# Patient Record
Sex: Female | Born: 1975 | Race: White | Hispanic: No | Marital: Married | Smoking: Never smoker
Health system: Southern US, Community
[De-identification: ages and names within clinical notes are randomized; demographics above are authoritative.]

## PROBLEM LIST (undated history)

## (undated) DIAGNOSIS — Z9889 Other specified postprocedural states: Secondary | ICD-10-CM

## (undated) DIAGNOSIS — C801 Malignant (primary) neoplasm, unspecified: Secondary | ICD-10-CM

## (undated) DIAGNOSIS — K219 Gastro-esophageal reflux disease without esophagitis: Secondary | ICD-10-CM

## (undated) DIAGNOSIS — F32A Depression, unspecified: Secondary | ICD-10-CM

## (undated) DIAGNOSIS — E119 Type 2 diabetes mellitus without complications: Secondary | ICD-10-CM

## (undated) DIAGNOSIS — I82409 Acute embolism and thrombosis of unspecified deep veins of unspecified lower extremity: Secondary | ICD-10-CM

## (undated) DIAGNOSIS — F419 Anxiety disorder, unspecified: Secondary | ICD-10-CM

## (undated) DIAGNOSIS — O24419 Gestational diabetes mellitus in pregnancy, unspecified control: Secondary | ICD-10-CM

## (undated) DIAGNOSIS — M1990 Unspecified osteoarthritis, unspecified site: Secondary | ICD-10-CM

## (undated) DIAGNOSIS — I10 Essential (primary) hypertension: Secondary | ICD-10-CM

## (undated) DIAGNOSIS — R7303 Prediabetes: Secondary | ICD-10-CM

## (undated) HISTORY — DX: Gestational diabetes mellitus in pregnancy, unspecified control: O24.419

---

## 2016-09-09 MED FILL — QSYMIA 3.75 MG-23 MG CAP: 3.75-23 | 14 days supply | Qty: 14 | Fill #0

## 2016-10-07 DIAGNOSIS — D235 Other benign neoplasm of skin of trunk: Secondary | ICD-10-CM | POA: Diagnosis not present

## 2016-10-07 DIAGNOSIS — D485 Neoplasm of uncertain behavior of skin: Secondary | ICD-10-CM | POA: Diagnosis not present

## 2016-10-07 DIAGNOSIS — L814 Other melanin hyperpigmentation: Secondary | ICD-10-CM | POA: Diagnosis not present

## 2016-10-07 DIAGNOSIS — D1801 Hemangioma of skin and subcutaneous tissue: Secondary | ICD-10-CM | POA: Diagnosis not present

## 2016-10-14 DIAGNOSIS — Z6838 Body mass index (BMI) 38.0-38.9, adult: Secondary | ICD-10-CM | POA: Diagnosis not present

## 2016-10-14 DIAGNOSIS — R635 Abnormal weight gain: Secondary | ICD-10-CM | POA: Diagnosis not present

## 2016-10-14 DIAGNOSIS — E669 Obesity, unspecified: Secondary | ICD-10-CM | POA: Diagnosis not present

## 2016-10-14 MED FILL — UNIFINE PENTIPS 32GX5/32": 32G X 4 MM | 90 days supply | Qty: 100 | Fill #0

## 2016-10-14 MED FILL — UNIFINE PENTIPS 32GX5/32: 32G X 4 MM | 90 days supply | Qty: 100 | Fill #0

## 2016-10-14 MED FILL — SAXENDA 18 MG/3 ML PEN: 18 | 28 days supply | Qty: 15 | Fill #0

## 2016-11-09 MED FILL — SAXENDA 18 MG/3 ML PEN: 18 | 28 days supply | Qty: 15 | Fill #1

## 2016-12-27 DIAGNOSIS — K5903 Drug induced constipation: Secondary | ICD-10-CM | POA: Diagnosis not present

## 2016-12-27 DIAGNOSIS — R635 Abnormal weight gain: Secondary | ICD-10-CM | POA: Diagnosis not present

## 2016-12-27 DIAGNOSIS — Z713 Dietary counseling and surveillance: Secondary | ICD-10-CM | POA: Diagnosis not present

## 2016-12-27 DIAGNOSIS — F419 Anxiety disorder, unspecified: Secondary | ICD-10-CM | POA: Diagnosis not present

## 2016-12-27 MED FILL — BUPROPION HCL XL 150 MG TAB: 150 | 30 days supply | Qty: 30 | Fill #0

## 2017-01-19 DIAGNOSIS — Z713 Dietary counseling and surveillance: Secondary | ICD-10-CM | POA: Diagnosis not present

## 2017-01-19 DIAGNOSIS — Z6841 Body Mass Index (BMI) 40.0 and over, adult: Secondary | ICD-10-CM | POA: Diagnosis not present

## 2017-01-25 MED FILL — BUPROPION HCL XL 300 MG TAB: 300 | 30 days supply | Qty: 30 | Fill #0

## 2017-03-03 DIAGNOSIS — R7301 Impaired fasting glucose: Secondary | ICD-10-CM | POA: Diagnosis not present

## 2017-03-03 DIAGNOSIS — R635 Abnormal weight gain: Secondary | ICD-10-CM | POA: Diagnosis not present

## 2017-03-03 DIAGNOSIS — Z8632 Personal history of gestational diabetes: Secondary | ICD-10-CM | POA: Diagnosis not present

## 2017-03-03 MED FILL — BUPROPION HCL XL 300 MG TAB: 300 | 30 days supply | Qty: 30 | Fill #1

## 2017-03-03 MED FILL — metFORMIN HCL 500 MG TABS: 500 | 90 days supply | Qty: 90 | Fill #0

## 2017-03-24 DIAGNOSIS — R7301 Impaired fasting glucose: Secondary | ICD-10-CM | POA: Diagnosis not present

## 2017-03-24 DIAGNOSIS — E669 Obesity, unspecified: Secondary | ICD-10-CM | POA: Diagnosis not present

## 2017-03-24 DIAGNOSIS — R635 Abnormal weight gain: Secondary | ICD-10-CM | POA: Diagnosis not present

## 2017-03-24 MED FILL — NALTREXONE 50 MG TABLET: 50 | 60 days supply | Qty: 30 | Fill #0

## 2017-04-04 MED FILL — BUPROPION HCL XL 300 MG TAB: 300 | 30 days supply | Qty: 30 | Fill #2

## 2017-04-06 DIAGNOSIS — Z6841 Body Mass Index (BMI) 40.0 and over, adult: Secondary | ICD-10-CM | POA: Diagnosis not present

## 2017-04-06 DIAGNOSIS — Z713 Dietary counseling and surveillance: Secondary | ICD-10-CM | POA: Diagnosis not present

## 2017-05-02 MED FILL — BUPROPION HCL XL 300 MG TAB: 300 | 90 days supply | Qty: 90 | Fill #0

## 2017-05-04 DIAGNOSIS — Z6841 Body Mass Index (BMI) 40.0 and over, adult: Secondary | ICD-10-CM | POA: Diagnosis not present

## 2017-05-04 DIAGNOSIS — Z713 Dietary counseling and surveillance: Secondary | ICD-10-CM | POA: Diagnosis not present

## 2017-05-08 MED FILL — VYVANSE 30 MG CAPSULE: 30 | 30 days supply | Qty: 30 | Fill #0

## 2017-05-19 DIAGNOSIS — Z7984 Long term (current) use of oral hypoglycemic drugs: Secondary | ICD-10-CM | POA: Diagnosis not present

## 2017-05-19 DIAGNOSIS — Z6841 Body Mass Index (BMI) 40.0 and over, adult: Secondary | ICD-10-CM | POA: Diagnosis not present

## 2017-05-19 DIAGNOSIS — R7301 Impaired fasting glucose: Secondary | ICD-10-CM | POA: Diagnosis not present

## 2017-06-12 MED FILL — metFORMIN HCL 500 MG TABS: 500 | 90 days supply | Qty: 90 | Fill #0

## 2017-06-12 MED FILL — VYVANSE 30 MG CAPSULE: 30 | 30 days supply | Qty: 30 | Fill #0

## 2017-07-24 MED FILL — SERTRALINE HCL 25 MG TABLET: 25 | 30 days supply | Qty: 30 | Fill #0

## 2017-07-24 MED FILL — VYVANSE 30 MG CAPSULE: 30 | 30 days supply | Qty: 30 | Fill #0

## 2017-08-03 DIAGNOSIS — Z6841 Body Mass Index (BMI) 40.0 and over, adult: Secondary | ICD-10-CM | POA: Diagnosis not present

## 2017-08-03 DIAGNOSIS — Z713 Dietary counseling and surveillance: Secondary | ICD-10-CM | POA: Diagnosis not present

## 2017-08-16 MED FILL — BUPROPION HCL XL 300 MG TAB: 300 | 90 days supply | Qty: 90 | Fill #0

## 2017-08-29 MED FILL — VYVANSE 30 MG CAPSULE: 30 | 30 days supply | Qty: 30 | Fill #0

## 2017-10-02 MED FILL — metFORMIN HCL 500 MG TABS: 500 | 90 days supply | Qty: 90 | Fill #0

## 2017-10-02 MED FILL — VYVANSE 30 MG CAPSULE: 30 | 30 days supply | Qty: 30 | Fill #0

## 2017-10-06 DIAGNOSIS — L821 Other seborrheic keratosis: Secondary | ICD-10-CM | POA: Diagnosis not present

## 2017-10-06 DIAGNOSIS — D225 Melanocytic nevi of trunk: Secondary | ICD-10-CM | POA: Diagnosis not present

## 2017-10-06 DIAGNOSIS — D1801 Hemangioma of skin and subcutaneous tissue: Secondary | ICD-10-CM | POA: Diagnosis not present

## 2017-10-13 DIAGNOSIS — Z6841 Body Mass Index (BMI) 40.0 and over, adult: Secondary | ICD-10-CM | POA: Diagnosis not present

## 2017-10-13 DIAGNOSIS — R7301 Impaired fasting glucose: Secondary | ICD-10-CM | POA: Diagnosis not present

## 2017-10-13 DIAGNOSIS — Z7984 Long term (current) use of oral hypoglycemic drugs: Secondary | ICD-10-CM | POA: Diagnosis not present

## 2017-11-03 MED FILL — VYVANSE 40 MG CAPSULE: 40 | 30 days supply | Qty: 30 | Fill #0

## 2017-11-29 MED FILL — BUPROPION HCL XL 300 MG TAB: 300 | 90 days supply | Qty: 90 | Fill #0

## 2017-12-01 MED FILL — VYVANSE 40 MG CAPSULE: 40 | 30 days supply | Qty: 30 | Fill #0

## 2017-12-15 DIAGNOSIS — R7301 Impaired fasting glucose: Secondary | ICD-10-CM | POA: Diagnosis not present

## 2017-12-15 DIAGNOSIS — Z6841 Body Mass Index (BMI) 40.0 and over, adult: Secondary | ICD-10-CM | POA: Diagnosis not present

## 2018-01-04 MED FILL — VYVANSE 40 MG CAPSULE: 40 | 30 days supply | Qty: 30 | Fill #0

## 2018-02-02 DIAGNOSIS — R41840 Attention and concentration deficit: Secondary | ICD-10-CM | POA: Diagnosis not present

## 2018-02-02 DIAGNOSIS — R7301 Impaired fasting glucose: Secondary | ICD-10-CM | POA: Diagnosis not present

## 2018-02-02 DIAGNOSIS — G4710 Hypersomnia, unspecified: Secondary | ICD-10-CM | POA: Diagnosis not present

## 2018-02-02 DIAGNOSIS — Z6841 Body Mass Index (BMI) 40.0 and over, adult: Secondary | ICD-10-CM | POA: Diagnosis not present

## 2018-02-02 MED FILL — VYVANSE 50 MG CAPSULE: 50 | 30 days supply | Qty: 30 | Fill #0

## 2018-03-06 MED FILL — BUPROPION HCL XL 300 MG TAB: 300 | 90 days supply | Qty: 90 | Fill #0

## 2018-03-06 MED FILL — metFORMIN HCL 500 MG TABS: 500 | 90 days supply | Qty: 90 | Fill #0

## 2018-03-06 MED FILL — VYVANSE 50 MG CAPSULE: 50 | 30 days supply | Qty: 30 | Fill #0

## 2018-04-06 DIAGNOSIS — Z7984 Long term (current) use of oral hypoglycemic drugs: Secondary | ICD-10-CM | POA: Diagnosis not present

## 2018-04-06 DIAGNOSIS — Z6841 Body Mass Index (BMI) 40.0 and over, adult: Secondary | ICD-10-CM | POA: Diagnosis not present

## 2018-04-06 DIAGNOSIS — R7301 Impaired fasting glucose: Secondary | ICD-10-CM | POA: Diagnosis not present

## 2018-04-06 MED FILL — VYVANSE 50 MG CAPSULE: 50 | 30 days supply | Qty: 30 | Fill #0

## 2018-04-13 DIAGNOSIS — F4322 Adjustment disorder with anxiety: Secondary | ICD-10-CM | POA: Diagnosis not present

## 2018-05-04 DIAGNOSIS — F4322 Adjustment disorder with anxiety: Secondary | ICD-10-CM | POA: Diagnosis not present

## 2018-05-08 MED FILL — VYVANSE 60 MG CAPSULE: 60 | 30 days supply | Qty: 30 | Fill #0

## 2018-05-10 ENCOUNTER — Ambulatory Visit (INDEPENDENT_AMBULATORY_CARE_PROVIDER_SITE_OTHER): Payer: Self-pay | Admitting: Family Medicine

## 2018-05-10 VITALS — BP 110/80 | HR 88 | Temp 98.3°F | Resp 16 | Wt 257.2 lb

## 2018-05-10 DIAGNOSIS — Z0000 Encounter for general adult medical examination without abnormal findings: Secondary | ICD-10-CM

## 2018-05-10 NOTE — Patient Instructions (Signed)

## 2018-05-10 NOTE — Progress Notes (Signed)
Audrey Rice is a 42 y.o. female who presents today with need for annual physical. She denies any chronic health conditions and is followed by provider for weight management. She denies any acute health concerns today.  Review of Systems  Constitutional: Negative for chills, fever and malaise/fatigue.  HENT: Negative for congestion, ear discharge, ear pain, sinus pain and sore throat.   Eyes: Negative.   Respiratory: Negative for cough, sputum production and shortness of breath.   Cardiovascular: Negative.  Negative for chest pain.  Gastrointestinal: Negative for abdominal pain, diarrhea, nausea and vomiting.  Genitourinary: Negative for dysuria, frequency, hematuria and urgency.  Musculoskeletal: Negative for myalgias.  Skin: Negative.   Neurological: Negative for headaches.  Endo/Heme/Allergies: Negative.   Psychiatric/Behavioral: Negative.     O: Vitals:   05/10/18 1012  BP: 110/80  Pulse: 88  Resp: 16  Temp: 98.3 F (36.8 C)  SpO2: 99%     Physical Exam  Constitutional: She is oriented to person, place, and time. Vital signs are normal. She appears well-developed and well-nourished. She is active.  Non-toxic appearance. She does not have a sickly appearance.  HENT:  Head: Normocephalic.  Right Ear: Hearing, tympanic membrane, external ear and ear canal normal.  Left Ear: Hearing, tympanic membrane, external ear and ear canal normal.  Nose: Nose normal.  Mouth/Throat: Uvula is midline and oropharynx is clear and moist.  Neck: Normal range of motion. Neck supple.  Cardiovascular: Normal rate, regular rhythm, normal heart sounds and normal pulses.  Pulmonary/Chest: Effort normal and breath sounds normal.  Abdominal: Soft. Bowel sounds are normal.  Musculoskeletal: Normal range of motion.  Lymphadenopathy:       Head (right side): No submental and no submandibular adenopathy present.       Head (left side): No submental and no submandibular adenopathy present.    She  has no cervical adenopathy.  Neurological: She is alert and oriented to person, place, and time. She has normal strength. No cranial nerve deficit or sensory deficit. She displays a negative Romberg sign. GCS eye subscore is 4. GCS verbal subscore is 5. GCS motor subscore is 6.  Cognitive recall, balance and strength intact  Psychiatric: She has a normal mood and affect.  PHQ-9- negative  Vitals reviewed.    A: 1. Physical exam      P: Exam findings, diagnosis etiology and medication use and indications reviewed with patient. Follow- Up and discharge instructions provided. No emergent/urgent issues found on exam.  Patient verbalized understanding of information provided and agrees with plan of care (POC), all questions answered.  1. Physical exam No acute findings exam WNL

## 2018-06-15 DIAGNOSIS — F4322 Adjustment disorder with anxiety: Secondary | ICD-10-CM | POA: Diagnosis not present

## 2018-06-18 MED FILL — VYVANSE 60 MG CAPSULE: 60 | 30 days supply | Qty: 30 | Fill #0

## 2018-07-04 MED FILL — BUPROPION HCL XL 300 MG TAB: 300 | 90 days supply | Qty: 90 | Fill #0

## 2018-07-04 MED FILL — metFORMIN HCL 500 MG TABS: 500 | 90 days supply | Qty: 90 | Fill #0

## 2018-07-06 DIAGNOSIS — F4322 Adjustment disorder with anxiety: Secondary | ICD-10-CM | POA: Diagnosis not present

## 2018-07-20 MED FILL — VYVANSE 60 MG CAPSULE: 60 | 30 days supply | Qty: 30 | Fill #0

## 2018-07-27 DIAGNOSIS — F4322 Adjustment disorder with anxiety: Secondary | ICD-10-CM | POA: Diagnosis not present

## 2018-08-24 MED FILL — VYVANSE 60 MG CAPSULE: 60 | 30 days supply | Qty: 30 | Fill #0

## 2018-08-27 DIAGNOSIS — F4322 Adjustment disorder with anxiety: Secondary | ICD-10-CM | POA: Diagnosis not present

## 2018-09-21 MED FILL — VYVANSE 60 MG CAPSULE: 60 | 30 days supply | Qty: 30 | Fill #0

## 2018-09-24 DIAGNOSIS — F4322 Adjustment disorder with anxiety: Secondary | ICD-10-CM | POA: Diagnosis not present

## 2018-09-24 MED FILL — BuPROPion HCL ER (XL) 300 M: 300 | 90 days supply | Qty: 90 | Fill #0

## 2018-09-24 MED FILL — metFORMIN HCL 500 MG TABS: 500 | 90 days supply | Qty: 90 | Fill #0

## 2018-10-08 DIAGNOSIS — D225 Melanocytic nevi of trunk: Secondary | ICD-10-CM | POA: Diagnosis not present

## 2018-10-08 DIAGNOSIS — D1801 Hemangioma of skin and subcutaneous tissue: Secondary | ICD-10-CM | POA: Diagnosis not present

## 2018-10-08 DIAGNOSIS — L738 Other specified follicular disorders: Secondary | ICD-10-CM | POA: Diagnosis not present

## 2018-10-08 DIAGNOSIS — L821 Other seborrheic keratosis: Secondary | ICD-10-CM | POA: Diagnosis not present

## 2018-10-19 MED FILL — CLINDAMYCIN PH 1% SOLUTION: 1 | 30 days supply | Qty: 60 | Fill #0

## 2018-10-30 MED FILL — VYVANSE 60 MG CAPSULE: 60 | 30 days supply | Qty: 30 | Fill #0

## 2018-11-06 DIAGNOSIS — F4322 Adjustment disorder with anxiety: Secondary | ICD-10-CM | POA: Diagnosis not present

## 2018-12-06 MED FILL — CLINDAMYCIN PH 1% SOLUTION: 1 | 30 days supply | Qty: 60 | Fill #1

## 2018-12-14 DIAGNOSIS — F4322 Adjustment disorder with anxiety: Secondary | ICD-10-CM | POA: Diagnosis not present

## 2019-01-01 DIAGNOSIS — Z6841 Body Mass Index (BMI) 40.0 and over, adult: Secondary | ICD-10-CM | POA: Diagnosis not present

## 2019-01-01 DIAGNOSIS — R5383 Other fatigue: Secondary | ICD-10-CM | POA: Diagnosis not present

## 2019-01-01 DIAGNOSIS — E559 Vitamin D deficiency, unspecified: Secondary | ICD-10-CM | POA: Diagnosis not present

## 2019-01-01 DIAGNOSIS — R7301 Impaired fasting glucose: Secondary | ICD-10-CM | POA: Diagnosis not present

## 2019-01-01 DIAGNOSIS — F329 Major depressive disorder, single episode, unspecified: Secondary | ICD-10-CM | POA: Diagnosis not present

## 2019-01-03 MED FILL — VYVANSE 30 MG CAPSULE: 30 | 21 days supply | Qty: 15 | Fill #0

## 2019-02-04 DIAGNOSIS — Z6841 Body Mass Index (BMI) 40.0 and over, adult: Secondary | ICD-10-CM | POA: Diagnosis not present

## 2019-02-04 DIAGNOSIS — Z713 Dietary counseling and surveillance: Secondary | ICD-10-CM | POA: Diagnosis not present

## 2019-02-04 DIAGNOSIS — R948 Abnormal results of function studies of other organs and systems: Secondary | ICD-10-CM | POA: Diagnosis not present

## 2019-02-08 MED FILL — metFORMIN HCL 500 MG TABS: 500 | 90 days supply | Qty: 90 | Fill #0

## 2019-02-08 MED FILL — buPROPion HCL ER (XL) 300 M: 300 | 90 days supply | Qty: 90 | Fill #0

## 2019-02-12 DIAGNOSIS — Z6841 Body Mass Index (BMI) 40.0 and over, adult: Secondary | ICD-10-CM | POA: Diagnosis not present

## 2019-02-12 DIAGNOSIS — E669 Obesity, unspecified: Secondary | ICD-10-CM | POA: Diagnosis not present

## 2019-02-12 MED FILL — LOMAIRA 8 MG TABLET: 8 | 30 days supply | Qty: 30 | Fill #0

## 2019-04-01 MED FILL — LOMAIRA 8 MG TABLET: 8 | 30 days supply | Qty: 30 | Fill #0

## 2019-05-15 MED FILL — metFORMIN HCL 500 MG TABS: 500 | 90 days supply | Qty: 90 | Fill #0

## 2019-05-15 MED FILL — LOMAIRA 8 MG TABLET: 8 | 30 days supply | Qty: 30 | Fill #0

## 2019-05-15 MED FILL — buPROPion HCL ER (XL) 300 M: 300 | 90 days supply | Qty: 90 | Fill #0

## 2019-06-24 MED FILL — LOMAIRA 8 MG TABLET: 8 | 30 days supply | Qty: 30 | Fill #0

## 2019-08-01 MED FILL — LOMAIRA 8 MG TABLET: 8 | 30 days supply | Qty: 30 | Fill #0

## 2019-08-27 DIAGNOSIS — E8881 Metabolic syndrome: Secondary | ICD-10-CM | POA: Diagnosis not present

## 2019-08-27 DIAGNOSIS — Z6841 Body Mass Index (BMI) 40.0 and over, adult: Secondary | ICD-10-CM | POA: Diagnosis not present

## 2019-08-27 DIAGNOSIS — R635 Abnormal weight gain: Secondary | ICD-10-CM | POA: Diagnosis not present

## 2019-08-27 MED FILL — buPROPion HCL ER (XL) 300 M: 300 | 90 days supply | Qty: 90 | Fill #0

## 2019-08-27 MED FILL — buPROPion HCL ER (XL) 150 M: 150 | 90 days supply | Qty: 90 | Fill #0

## 2019-08-27 MED FILL — metFORMIN HCL 500 MG TABS: 500 | 90 days supply | Qty: 180 | Fill #0

## 2019-08-30 MED FILL — LOMAIRA 8 MG TABLET: 8 | 30 days supply | Qty: 60 | Fill #0

## 2019-09-24 DIAGNOSIS — Z6841 Body Mass Index (BMI) 40.0 and over, adult: Secondary | ICD-10-CM | POA: Diagnosis not present

## 2019-09-24 DIAGNOSIS — R7301 Impaired fasting glucose: Secondary | ICD-10-CM | POA: Diagnosis not present

## 2019-09-24 DIAGNOSIS — R635 Abnormal weight gain: Secondary | ICD-10-CM | POA: Diagnosis not present

## 2019-10-02 DIAGNOSIS — H52213 Irregular astigmatism, bilateral: Secondary | ICD-10-CM | POA: Diagnosis not present

## 2019-10-02 DIAGNOSIS — H524 Presbyopia: Secondary | ICD-10-CM | POA: Diagnosis not present

## 2019-10-02 DIAGNOSIS — H5213 Myopia, bilateral: Secondary | ICD-10-CM | POA: Diagnosis not present

## 2019-10-16 ENCOUNTER — Encounter: Payer: Self-pay | Admitting: Medical

## 2019-10-16 MED FILL — LOMAIRA 8 MG TABLET: 8 | 30 days supply | Qty: 60 | Fill #0

## 2019-11-04 ENCOUNTER — Encounter: Payer: Self-pay | Admitting: Advanced Practice Midwife

## 2019-11-04 ENCOUNTER — Ambulatory Visit (INDEPENDENT_AMBULATORY_CARE_PROVIDER_SITE_OTHER): Payer: 59 | Admitting: Advanced Practice Midwife

## 2019-11-04 ENCOUNTER — Other Ambulatory Visit: Payer: Self-pay

## 2019-11-04 VITALS — BP 126/91 | HR 83 | Wt 277.1 lb

## 2019-11-04 DIAGNOSIS — Z1151 Encounter for screening for human papillomavirus (HPV): Secondary | ICD-10-CM

## 2019-11-04 DIAGNOSIS — Z1231 Encounter for screening mammogram for malignant neoplasm of breast: Secondary | ICD-10-CM

## 2019-11-04 DIAGNOSIS — Z124 Encounter for screening for malignant neoplasm of cervix: Secondary | ICD-10-CM | POA: Diagnosis not present

## 2019-11-04 DIAGNOSIS — Z01419 Encounter for gynecological examination (general) (routine) without abnormal findings: Secondary | ICD-10-CM | POA: Diagnosis not present

## 2019-11-04 DIAGNOSIS — Z9189 Other specified personal risk factors, not elsewhere classified: Secondary | ICD-10-CM | POA: Insufficient documentation

## 2019-11-04 NOTE — Patient Instructions (Signed)

## 2019-11-04 NOTE — Progress Notes (Signed)
GYNECOLOGY ANNUAL PREVENTATIVE CARE ENCOUNTER NOTE  History:     Audrey Rice is a 43 y.o. [id number] female here for a routine annual gynecologic exam.  Current complaints: none.   Denies abnormal vaginal bleeding, discharge, pelvic pain, problems with intercourse or other gynecologic concerns.    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 Denies SI, HI, IPV. Difficulty finding time for exercise and self-care amidst COVID precautions but overall happy with her work/life balance.   Gynecologic History Patient's last menstrual period was 10/17/2019 (approximate). Contraception: vasectomy Last Pap: 7 years ago. Results were: normal with negative HPV  Obstetric History OB History  Gravida Para Term Preterm AB Living  3 3 3     3   SAB TAB Ectopic Multiple Live Births          3    # Outcome Date GA Lbr Len/2nd Weight Sex Delivery Anes PTL Lv  3 Term 2012     Vag-Spont     2 Term 2009     Vag-Spont     1 Term 2006     Vag-Spont       No past medical history on file.   Current Outpatient Medications on File Prior to Visit  Medication Sig Dispense Refill  . buPROPion (WELLBUTRIN XL) 300 MG 24 hr tablet Take 450 mg by mouth daily.     Marland Kitchen MAGNESIUM PO Take by mouth.    . Multiple Vitamin (MULTIVITAMIN) capsule Take by mouth.    . Phentermine HCl 8 MG TABS 8 mg 2 (two) times daily as needed.    . vitamin B-12 (CYANOCOBALAMIN) 100 MCG tablet Take by mouth.     No current facility-administered medications on file prior to visit.     Not on File  Social History:  reports that she has never smoked. She has never used smokeless tobacco.  No family history on file.  The following portions of the patient's history were reviewed and updated as appropriate: allergies, current medications, past family history, past medical history, past social history, past surgical history and problem list.  Review of Systems Pertinent items noted in HPI and remainder of  comprehensive ROS otherwise negative.  Physical Exam:  BP (!) 126/91   Pulse 83   Wt 277 lb 1.6 oz (125.7 kg)   LMP 10/17/2019 (Approximate)  CONSTITUTIONAL: Well-developed, well-nourished female in no acute distress.  HENT:  Normocephalic, atraumatic, External right and left ear normal. Oropharynx is clear and moist EYES: Conjunctivae and EOM are normal. Pupils are equal, round, and reactive to light. No scleral icterus.  NECK: Normal range of motion, supple, no masses.  Normal thyroid.  SKIN: Skin is warm and dry. No rash noted. Not diaphoretic. No erythema. No pallor. MUSCULOSKELETAL: Normal range of motion. No tenderness.  No cyanosis, clubbing, or edema.  2+ distal pulses. NEUROLOGIC: Alert and oriented to person, place, and time. Normal reflexes, muscle tone coordination.  PSYCHIATRIC: Normal mood and affect. Normal behavior. Normal judgment and thought content. CARDIOVASCULAR: Normal heart rate noted, regular rhythm RESPIRATORY: Clear to auscultation bilaterally. Effort and breath sounds normal, no problems with respiration noted. BREASTS: Symmetric in size. No masses, tenderness, skin changes, nipple drainage, or lymphadenopathy bilaterally. ABDOMEN: Soft, no distention noted.  No tenderness, rebound or guarding.  PELVIC: Normal appearing external genitalia and urethral meatus; normal appearing vaginal mucosa and cervix.  No abnormal discharge noted.  Pap smear obtained.  Normal uterine size, no other palpable masses, no uterine or  adnexal tenderness.   Assessment and Plan:    1. Well woman exam - No concerning findings on physical exam - Cytology - PAP( [place]) - Vitamin D (25 hydroxy) - CBC - Hemoglobin [id number] - Basic Metabolic Panel (BMET)  2. Encounter for screening mammogram for breast cancer  - MM 3D SCREEN BREAST BILATERAL; Future  Will follow up results of pap smear and manage accordingly. Mammogram scheduled Routine preventative health maintenance measures  emphasized. Please refer to After Visit Summary for other counseling recommendations.      Mallie Snooks, MSN, CNM Certified Nurse Midwife, [facility] for [employer], [place] 11/04/19 8:04 PM

## 2019-11-05 LAB — BASIC METABOLIC PANEL
BUN/Creatinine Ratio: 18 (ref 9–23)
BUN: 17 mg/dL (ref 6–24)
CO2: 22 mmol/L (ref 20–29)
Calcium: 9.6 mg/dL (ref 8.7–10.2)
Chloride: 105 mmol/L (ref 96–106)
Creatinine, Ser: 0.97 mg/dL (ref 0.57–1.00)
GFR calc Af Amer: 84 mL/min/{1.73_m2} (ref >59)
GFR calc non Af Amer: 73 mL/min/{1.73_m2} (ref >59)
Glucose: 92 mg/dL (ref 65–99)
Potassium: 5 mmol/L (ref 3.5–5.2)
Sodium: 140 mmol/L (ref 134–144)

## 2019-11-05 LAB — CBC
Hematocrit: 40.2 % (ref 34.0–46.6)
Hemoglobin: 13 g/dL (ref 11.1–15.9)
MCH: 28.4 pg (ref 26.6–33.0)
MCHC: 32.3 g/dL (ref 31.5–35.7)
MCV: 88 fL (ref 79–97)
Platelets: 318 10*3/uL (ref 150–450)
RBC: 4.57 x10E6/uL (ref 3.77–5.28)
RDW: 13.8 % (ref 11.7–15.4)
WBC: 9.6 10*3/uL (ref 3.4–10.8)

## 2019-11-05 LAB — HEMOGLOBIN A1C
Est. average glucose Bld gHb Est-mCnc: 114 mg/dL
Hgb A1c MFr Bld: 5.6 % (ref 4.8–5.6)

## 2019-11-05 LAB — VITAMIN D 25 HYDROXY (VIT D DEFICIENCY, FRACTURES): Vit D, 25-Hydroxy: 38.2 ng/mL (ref 30.0–100.0)

## 2019-11-08 LAB — CYTOLOGY - PAP
Comment: NEGATIVE
Diagnosis: NEGATIVE
High risk HPV: NEGATIVE

## 2019-12-27 ENCOUNTER — Ambulatory Visit: Payer: 59

## 2020-01-03 MED FILL — LOMAIRA 8 MG TABLET: 8 | 30 days supply | Qty: 60 | Fill #0

## 2020-01-03 MED FILL — BUPROPION HCL ER (XL) 300 M: 300 | 90 days supply | Qty: 90 | Fill #0

## 2020-01-03 MED FILL — buPROPion HCL ER (XL) 150 M: 150 | 90 days supply | Qty: 90 | Fill #0

## 2020-01-28 ENCOUNTER — Ambulatory Visit
Admission: RE | Admit: 2020-01-28 | Discharge: 2020-01-28 | Disposition: A | Discharge status: Home / Self Care | Payer: 59 | Source: Ambulatory Visit | Attending: Advanced Practice Midwife | Admitting: Advanced Practice Midwife

## 2020-01-28 ENCOUNTER — Other Ambulatory Visit: Payer: Self-pay

## 2020-01-28 DIAGNOSIS — Z1231 Encounter for screening mammogram for malignant neoplasm of breast: Secondary | ICD-10-CM

## 2020-01-29 ENCOUNTER — Other Ambulatory Visit: Payer: Self-pay | Admitting: Advanced Practice Midwife

## 2020-01-29 DIAGNOSIS — R928 Other abnormal and inconclusive findings on diagnostic imaging of breast: Secondary | ICD-10-CM

## 2020-02-06 ENCOUNTER — Ambulatory Visit
Admission: RE | Admit: 2020-02-06 | Discharge: 2020-02-06 | Disposition: A | Discharge status: Home / Self Care | Payer: 59 | Source: Ambulatory Visit | Attending: Advanced Practice Midwife | Admitting: Advanced Practice Midwife

## 2020-02-06 ENCOUNTER — Other Ambulatory Visit: Payer: Self-pay

## 2020-02-06 ENCOUNTER — Other Ambulatory Visit: Payer: Self-pay | Admitting: Advanced Practice Midwife

## 2020-02-06 DIAGNOSIS — R928 Other abnormal and inconclusive findings on diagnostic imaging of breast: Secondary | ICD-10-CM

## 2020-02-06 DIAGNOSIS — N6322 Unspecified lump in the left breast, upper inner quadrant: Secondary | ICD-10-CM | POA: Diagnosis not present

## 2020-02-11 ENCOUNTER — Other Ambulatory Visit: Payer: 59

## 2020-02-27 MED FILL — LOMAIRA 8 MG TABLET: 8 | 30 days supply | Qty: 60 | Fill #0

## 2020-03-24 DIAGNOSIS — F419 Anxiety disorder, unspecified: Secondary | ICD-10-CM | POA: Diagnosis not present

## 2020-03-24 DIAGNOSIS — Z6839 Body mass index (BMI) 39.0-39.9, adult: Secondary | ICD-10-CM | POA: Diagnosis not present

## 2020-03-24 DIAGNOSIS — R7301 Impaired fasting glucose: Secondary | ICD-10-CM | POA: Diagnosis not present

## 2020-03-24 DIAGNOSIS — E669 Obesity, unspecified: Secondary | ICD-10-CM | POA: Diagnosis not present

## 2020-04-02 MED FILL — BUPROPION HCL ER (XL) 300 M: 300 | 90 days supply | Qty: 90 | Fill #0

## 2020-04-02 MED FILL — buPROPion HCL ER (XL) 150 M: 150 | 90 days supply | Qty: 90 | Fill #0

## 2020-04-02 MED FILL — LOMAIRA 8 MG TABLET: 8 | 30 days supply | Qty: 60 | Fill #0

## 2020-07-23 MED FILL — buPROPion HCL ER (XL) 150 M: 150 | 90 days supply | Qty: 90 | Fill #0

## 2020-07-23 MED FILL — METFORMIN HCL 500 MG TABS: 500 | 90 days supply | Qty: 180 | Fill #0

## 2020-07-23 MED FILL — LOMAIRA 8 MG TABLET: 8 | 30 days supply | Qty: 60 | Fill #0

## 2020-07-23 MED FILL — BUPROPION HCL ER (XL) 300 M: 300 | 90 days supply | Qty: 90 | Fill #0

## 2020-08-11 ENCOUNTER — Other Ambulatory Visit: Payer: Self-pay

## 2020-08-11 ENCOUNTER — Other Ambulatory Visit: Payer: Self-pay | Admitting: Advanced Practice Midwife

## 2020-08-11 ENCOUNTER — Ambulatory Visit
Admission: RE | Admit: 2020-08-11 | Discharge: 2020-08-11 | Disposition: A | Discharge status: Home / Self Care | Payer: 59 | Source: Ambulatory Visit | Attending: Advanced Practice Midwife | Admitting: Advanced Practice Midwife

## 2020-08-11 DIAGNOSIS — R928 Other abnormal and inconclusive findings on diagnostic imaging of breast: Secondary | ICD-10-CM

## 2020-08-11 DIAGNOSIS — N6322 Unspecified lump in the left breast, upper inner quadrant: Secondary | ICD-10-CM | POA: Diagnosis not present

## 2020-08-19 DIAGNOSIS — E669 Obesity, unspecified: Secondary | ICD-10-CM | POA: Diagnosis not present

## 2020-08-19 DIAGNOSIS — Z6838 Body mass index (BMI) 38.0-38.9, adult: Secondary | ICD-10-CM | POA: Diagnosis not present

## 2020-08-19 DIAGNOSIS — R7301 Impaired fasting glucose: Secondary | ICD-10-CM | POA: Diagnosis not present

## 2020-08-24 DIAGNOSIS — F4323 Adjustment disorder with mixed anxiety and depressed mood: Secondary | ICD-10-CM | POA: Diagnosis not present

## 2020-09-03 DIAGNOSIS — F4323 Adjustment disorder with mixed anxiety and depressed mood: Secondary | ICD-10-CM | POA: Diagnosis not present

## 2020-09-21 DIAGNOSIS — F4323 Adjustment disorder with mixed anxiety and depressed mood: Secondary | ICD-10-CM | POA: Diagnosis not present

## 2020-10-05 DIAGNOSIS — H5213 Myopia, bilateral: Secondary | ICD-10-CM | POA: Diagnosis not present

## 2020-10-05 DIAGNOSIS — H52223 Regular astigmatism, bilateral: Secondary | ICD-10-CM | POA: Diagnosis not present

## 2020-10-05 DIAGNOSIS — F4323 Adjustment disorder with mixed anxiety and depressed mood: Secondary | ICD-10-CM | POA: Diagnosis not present

## 2020-10-06 ENCOUNTER — Other Ambulatory Visit (HOSPITAL_COMMUNITY): Payer: Self-pay | Admitting: Internal Medicine

## 2020-10-06 MED FILL — LOMAIRA 8 MG TABLET: 8 | 30 days supply | Qty: 60 | Fill #0

## 2020-10-19 DIAGNOSIS — F4323 Adjustment disorder with mixed anxiety and depressed mood: Secondary | ICD-10-CM | POA: Diagnosis not present

## 2020-11-17 DIAGNOSIS — F4323 Adjustment disorder with mixed anxiety and depressed mood: Secondary | ICD-10-CM | POA: Diagnosis not present

## 2020-12-15 DIAGNOSIS — F4323 Adjustment disorder with mixed anxiety and depressed mood: Secondary | ICD-10-CM | POA: Diagnosis not present

## 2020-12-28 ENCOUNTER — Other Ambulatory Visit (HOSPITAL_COMMUNITY): Payer: Self-pay | Admitting: Internal Medicine

## 2020-12-29 MED FILL — LOMAIRA 8 MG TABLET: 8 | 30 days supply | Qty: 60 | Fill #0

## 2020-12-29 MED FILL — METFORMIN HCL 500 MG TABS: 500 | 90 days supply | Qty: 180 | Fill #0

## 2020-12-29 MED FILL — buPROPion HCL ER (XL) 150 M: 150 | 90 days supply | Qty: 90 | Fill #0

## 2020-12-29 MED FILL — buPROPion HCL ER (XL) 300 M: 300 | 90 days supply | Qty: 90 | Fill #0

## 2021-02-10 ENCOUNTER — Other Ambulatory Visit: Payer: 59

## 2021-02-17 ENCOUNTER — Other Ambulatory Visit: Payer: Self-pay

## 2021-02-17 ENCOUNTER — Ambulatory Visit
Admission: RE | Admit: 2021-02-17 | Discharge: 2021-02-17 | Disposition: A | Discharge status: Home / Self Care | Payer: 59 | Source: Ambulatory Visit | Attending: Advanced Practice Midwife | Admitting: Advanced Practice Midwife

## 2021-02-17 DIAGNOSIS — N6322 Unspecified lump in the left breast, upper inner quadrant: Secondary | ICD-10-CM | POA: Diagnosis not present

## 2021-02-17 DIAGNOSIS — R928 Other abnormal and inconclusive findings on diagnostic imaging of breast: Secondary | ICD-10-CM | POA: Diagnosis not present

## 2021-02-26 ENCOUNTER — Other Ambulatory Visit (HOSPITAL_COMMUNITY): Payer: Self-pay | Admitting: Emergency Medicine

## 2021-03-04 ENCOUNTER — Other Ambulatory Visit: Payer: Self-pay

## 2021-03-04 ENCOUNTER — Ambulatory Visit (HOSPITAL_BASED_OUTPATIENT_CLINIC_OR_DEPARTMENT_OTHER)
Admission: RE | Admit: 2021-03-04 | Discharge: 2021-03-04 | Disposition: A | Discharge status: Home / Self Care | Payer: 59 | Source: Ambulatory Visit | Attending: Cardiology | Admitting: Cardiology

## 2021-11-02 ENCOUNTER — Encounter (HOSPITAL_BASED_OUTPATIENT_CLINIC_OR_DEPARTMENT_OTHER): Payer: Self-pay | Admitting: Family Medicine

## 2021-11-02 ENCOUNTER — Ambulatory Visit (HOSPITAL_BASED_OUTPATIENT_CLINIC_OR_DEPARTMENT_OTHER): Payer: 59 | Admitting: Family Medicine

## 2021-11-02 ENCOUNTER — Other Ambulatory Visit: Payer: Self-pay

## 2021-11-02 ENCOUNTER — Other Ambulatory Visit (HOSPITAL_COMMUNITY): Payer: Self-pay

## 2021-11-02 MED ORDER — METFORMIN HCL 500 MG PO TABS
500.0000 mg | ORAL_TABLET | Freq: Two times a day (BID) | ORAL | 1 refills | Status: DC
Start: 2021-11-02 — End: 2022-05-03
  Filled 2021-11-02 – 2021-11-15 (×2): qty 180, 90d supply, fill #0

## 2021-11-02 NOTE — Assessment & Plan Note (Signed)
Current BMI today is 48.  She has tried several different interventions in the past including various medications without long-lasting improvement in weight Current comorbidities include impaired fasting glucose She is interested in proceeding with bariatric surgery, has met with [place] surgery related to this.  We will provide letter which will be faxed over to [facility] surgery

## 2021-11-02 NOTE — Patient Instructions (Signed)
  Medication Instructions:  Your physician recommends that you continue on your current medications as directed. Please refer to the Current Medication list given to you today. --If you need a refill on any your medications before your next appointment, please call your pharmacy first. If no refills are authorized on file call the office.- Follow-Up: Your next appointment:   Your physician recommends that you schedule a follow-up appointment in: 6 MONTHS for CPE with Dr. de [place]  You will receive a text message or e-mail with a link to a survey about your care and experience with [place] today! We would greatly appreciate your feedback!   Thanks for letting us be apart of your health journey!!  Primary Care and Sports Medicine   Dr. Arlina Robes [place]   We encourage you to activate your patient portal called "MyChart".  Sign up information is provided on this After Visit Summary.  MyChart is used to connect with patients for Virtual Visits (Telemedicine).  Patients are able to view lab/test results, encounter notes, upcoming appointments, etc.  Non-urgent messages can be sent to your provider as well. To learn more about what you can do with MyChart, please visit --  [email or web address].

## 2021-11-02 NOTE — Progress Notes (Signed)
New Patient Office Visit  Subjective:  Patient ID: Audrey Rice, female    DOB: Feb 11, 1976  Age: 45 y.o. MRN: [id number]  CC:  Chief Complaint  Patient presents with   Establish Care    No prior PCP    Weight Loss    Patient presents today to discuss weight loss options. She has tried phentermine, Wellbutrin, Topamax, and a combination of the medications together with no significant improvement. She admits to trying an injectable in 2015 but doesn't remember the name of the medication    HPI Audrey Rice is a 45 year old female presenting to establish in clinic.  She has current concerns as outlined above.  Reports past medical history significant for gestational diabetes, obesity.  Patient's primary concern today is related to her weight.  She has tried various weight management interventions including lifestyle modifications, medications including phentermine, Topamax, Wellbutrin, metformin.  She was being seen at [facility] weight management clinic for years and had some varying degrees of success during that time, however she has not been able to maintain her weight loss.  She is planning to proceed with surgical intervention related to this and is requesting a letter of medical necessity.  On chart review it appears that patient has had some associated impaired fasting glucose related to her obesity.  No issues with hypertension in the past.  Patient works as a Psychologist, educational for [employer].  She attended [employer] for [place], was not in [place] for medical school.  She has worked at [employer] for about 10 years.  Past Medical History:  Diagnosis Date   Gestational diabetes     History reviewed. No pertinent surgical history.  Family History  Problem Relation Age of Onset   Ulcerative colitis Mother    Hypothyroidism Mother    Hypothyroidism Father    Cancer Maternal Aunt        breast cancer   Diabetes Maternal Grandmother     Social History    Socioeconomic History   Marital status: Married    Spouse name: Not on file   Number of children: Not on file   Years of education: Not on file   Highest education level: Not on file  Occupational History   Not on file  Tobacco Use   Smoking status: Never   Smokeless tobacco: Never  Vaping Use   Vaping Use: Never used  Substance and Sexual Activity   Alcohol use: Never   Drug use: Never   Sexual activity: Yes    Birth control/protection: None  Other Topics Concern   Not on file  Social History Narrative   Not on file   Social Determinants of Health   Financial Resource Strain: Not on file  Food Insecurity: Not on file  Transportation Needs: Not on file  Physical Activity: Not on file  Stress: Not on file  Social Connections: Not on file  Intimate Partner Violence: Not on file    Objective:   Today's Vitals: BP 124/86   Pulse 92   Ht $R'5\' 6"'uB$  (1.676 m)   Wt (!) 302 lb 9.6 oz (137.3 kg)   LMP 10/17/2021   SpO2 99%   BMI 48.84 kg/m   Physical Exam  45 year old female in no acute distress Cardiovascular exam with regular rate and rhythm, no murmurs appreciated Lungs clear to auscultation bilaterally  Assessment & Plan:   Problem List Items Addressed This Visit       Other   Severe  obesity (BMI >= 40) (HCC)    Current BMI today is 48.  She has tried several different interventions in the past including various medications without long-lasting improvement in weight Current comorbidities include impaired fasting glucose She is interested in proceeding with bariatric surgery, has met with [facility] related to this.  We will provide letter which will be faxed over to [facility] surgery      Relevant Medications   metFORMIN (GLUCOPHAGE) 500 MG tablet    Outpatient Encounter Medications as of 11/02/2021  Medication Sig   [DISCONTINUED] metFORMIN (GLUCOPHAGE) 500 MG tablet TAKE 1 TABLET (500 MG TOTAL) BY MOUTH 2 TIMES DAILY WITH MEALS.    metFORMIN (GLUCOPHAGE) 500 MG tablet Take 1 tablet (500 mg total) by mouth 2 (two) times daily with a meal.   [DISCONTINUED] buPROPion (WELLBUTRIN XL) 150 MG 24 hr tablet TAKE 1 TABLET (150 MG TOTAL) BY MOUTH DAILY. TAKE WITH 300 MG WELLBUTRIN FOR TOTAL 450 MG DAILY.   [DISCONTINUED] buPROPion (WELLBUTRIN XL) 300 MG 24 hr tablet Take 450 mg by mouth daily.    [DISCONTINUED] buPROPion (WELLBUTRIN XL) 300 MG 24 hr tablet TAKE 1 TABLET (300 MG TOTAL) BY MOUTH EVERY MORNING.   [DISCONTINUED] MAGNESIUM PO Take by mouth.   [DISCONTINUED] Multiple Vitamin (MULTIVITAMIN) capsule Take by mouth.   [DISCONTINUED] Phentermine HCl 8 MG TABS 8 mg 2 (two) times daily as needed.   [DISCONTINUED] Phentermine HCl 8 MG TABS TAKE 1 TABLET BY MOUTH TWICE DAILY.   [DISCONTINUED] Phentermine HCl 8 MG TABS TAKE 1 TABLET BY MOUTH TWICE DAILY.   [DISCONTINUED] vitamin B-12 (CYANOCOBALAMIN) 100 MCG tablet Take by mouth.   No facility-administered encounter medications on file as of 11/02/2021.    Follow-up: Return in about 6 months (around 05/02/2022).  Plan for follow-up in about 6 months for CPE.  Patient came, labs completed before doctors day which is typically done during April.  She will bring these labs into the office for review at CPE  Alphonsine Minium J De [place], MD

## 2021-11-04 ENCOUNTER — Encounter (HOSPITAL_BASED_OUTPATIENT_CLINIC_OR_DEPARTMENT_OTHER): Payer: Self-pay

## 2021-11-10 ENCOUNTER — Other Ambulatory Visit (HOSPITAL_COMMUNITY): Payer: Self-pay

## 2021-11-15 ENCOUNTER — Other Ambulatory Visit (HOSPITAL_COMMUNITY): Payer: Self-pay

## 2021-11-23 ENCOUNTER — Other Ambulatory Visit (HOSPITAL_COMMUNITY): Payer: Self-pay

## 2021-11-24 ENCOUNTER — Ambulatory Visit (HOSPITAL_BASED_OUTPATIENT_CLINIC_OR_DEPARTMENT_OTHER): Payer: 59 | Admitting: Family Medicine

## 2021-11-25 ENCOUNTER — Ambulatory Visit (HOSPITAL_BASED_OUTPATIENT_CLINIC_OR_DEPARTMENT_OTHER): Payer: 59 | Admitting: Family Medicine

## 2021-11-30 ENCOUNTER — Other Ambulatory Visit (HOSPITAL_COMMUNITY): Payer: Self-pay | Admitting: Surgery

## 2021-11-30 ENCOUNTER — Other Ambulatory Visit: Payer: Self-pay | Admitting: Surgery

## 2021-12-08 ENCOUNTER — Other Ambulatory Visit (HOSPITAL_COMMUNITY): Payer: Self-pay | Admitting: Surgery

## 2021-12-08 ENCOUNTER — Ambulatory Visit (HOSPITAL_COMMUNITY)
Admission: RE | Admit: 2021-12-08 | Discharge: 2021-12-08 | Disposition: A | Discharge status: Home / Self Care | Payer: 59 | Source: Ambulatory Visit | Attending: Surgery | Admitting: Surgery

## 2021-12-08 ENCOUNTER — Other Ambulatory Visit: Payer: Self-pay

## 2021-12-08 DIAGNOSIS — K219 Gastro-esophageal reflux disease without esophagitis: Secondary | ICD-10-CM | POA: Diagnosis not present

## 2021-12-08 DIAGNOSIS — K449 Diaphragmatic hernia without obstruction or gangrene: Secondary | ICD-10-CM | POA: Diagnosis not present

## 2021-12-08 DIAGNOSIS — Z01818 Encounter for other preprocedural examination: Secondary | ICD-10-CM | POA: Diagnosis not present

## 2021-12-08 DIAGNOSIS — K5900 Constipation, unspecified: Secondary | ICD-10-CM | POA: Diagnosis not present

## 2021-12-16 IMAGING — US US BREAST*L* LIMITED INC AXILLA
1 series · 6 of 6 positions shown · non-contrast
Comparison: Previous exam(s).

CLINICAL DATA: 43-year-old female for further evaluation of
possible LEFT breast mass on baseline screening mammogram.

EXAM:
DIGITAL DIAGNOSTIC LEFT MAMMOGRAM WITH TOMO
ULTRASOUND LEFT BREAST

[Series 1: us breast*left* limited inc axilla · 0.07mm/px · 6 of 6 slices shown]
[im 1/6]
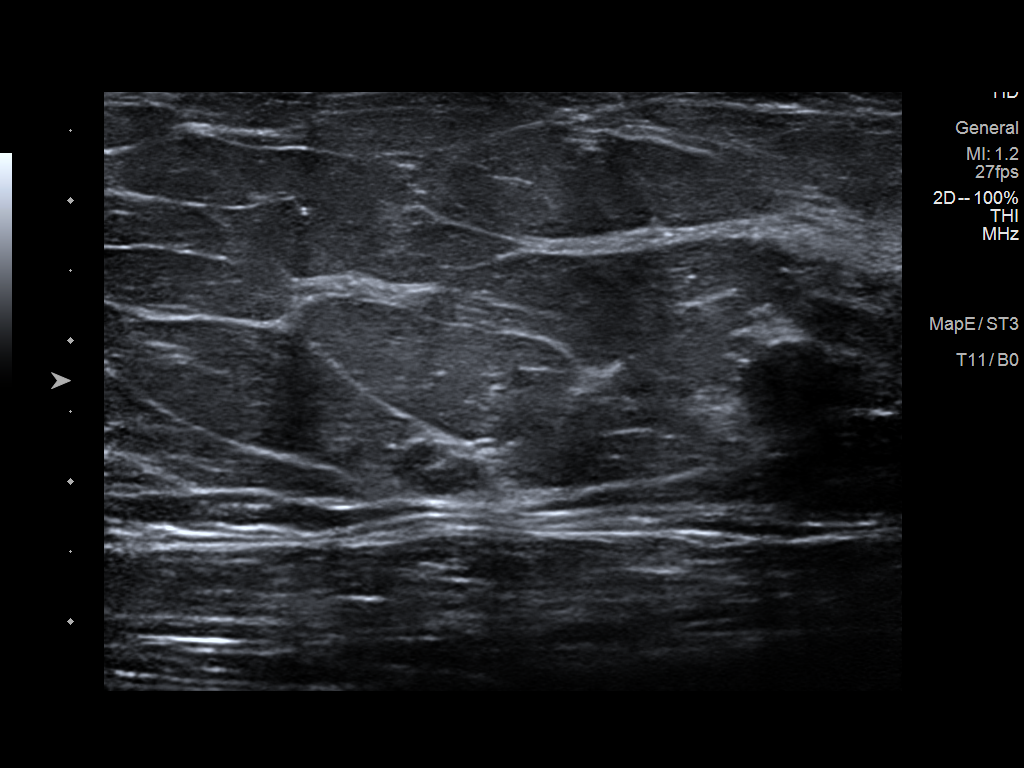
[im 2/6]
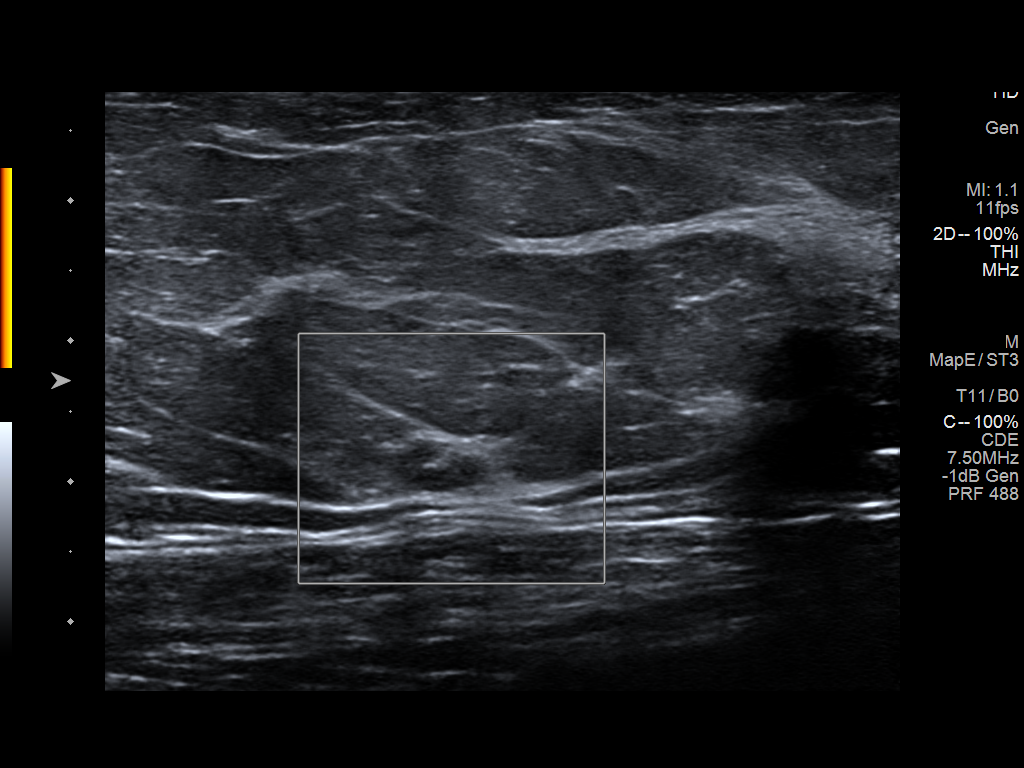
[im 3/6]
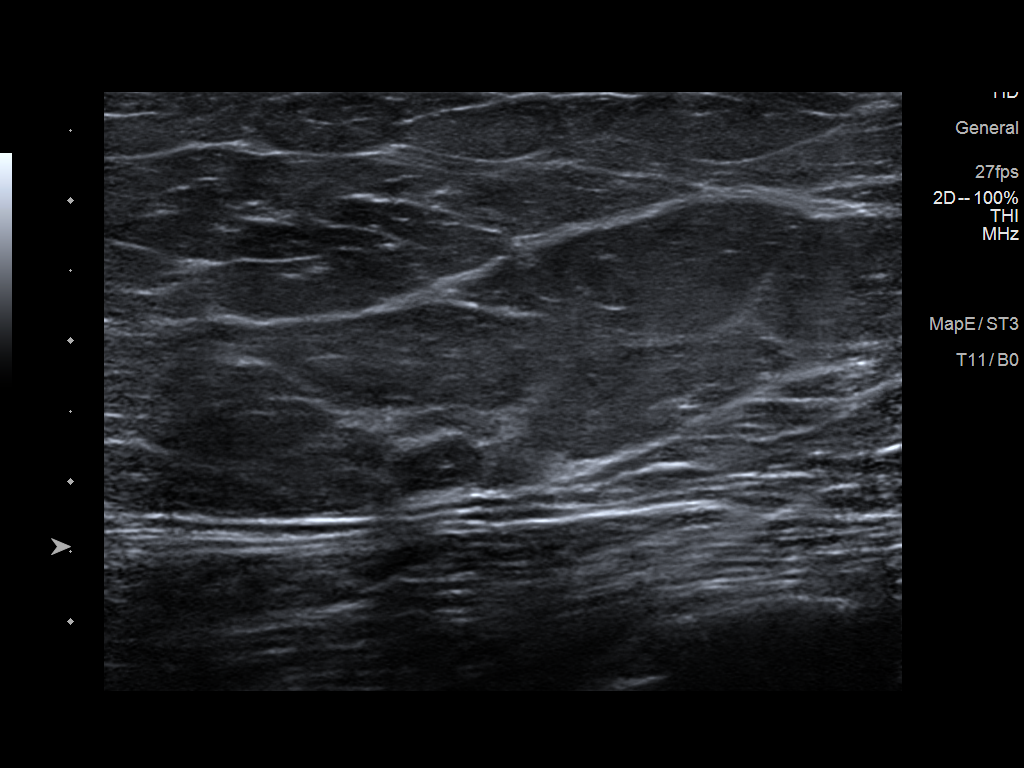
[im 4/6]
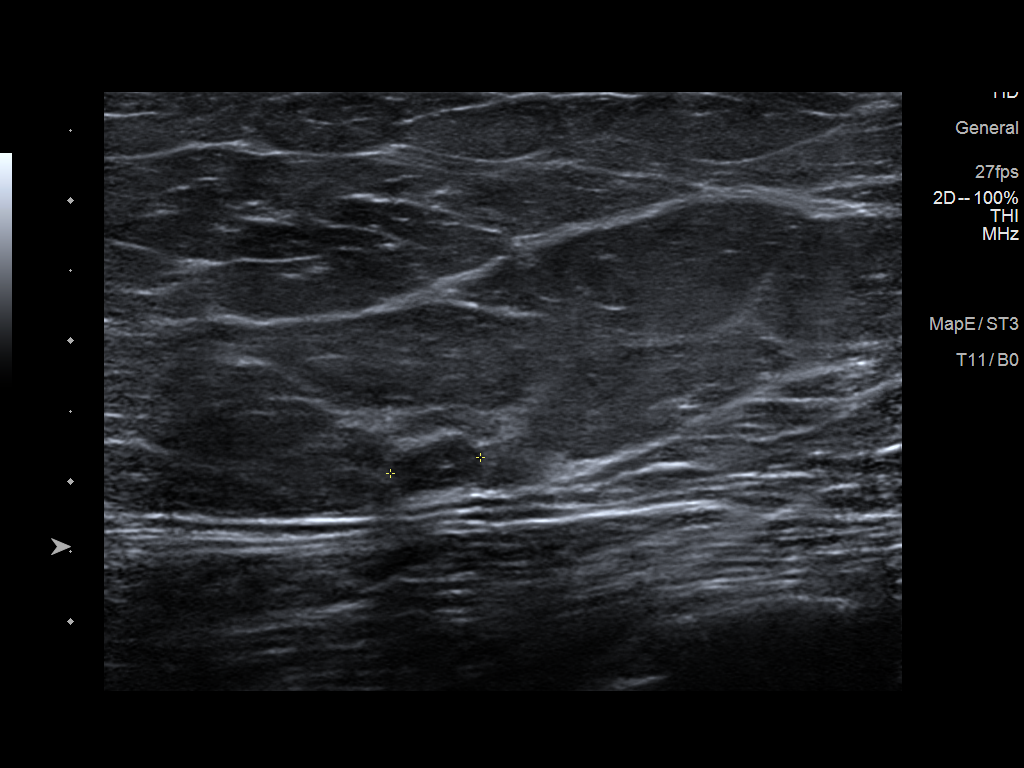
[im 5/6]
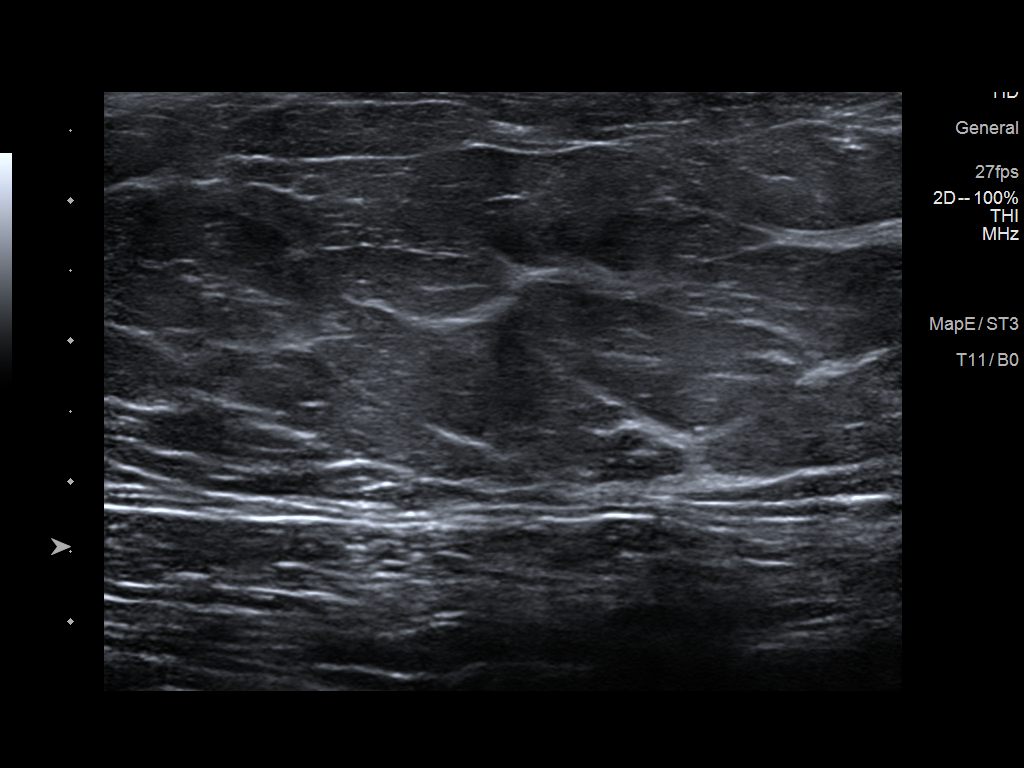
[im 6/6]
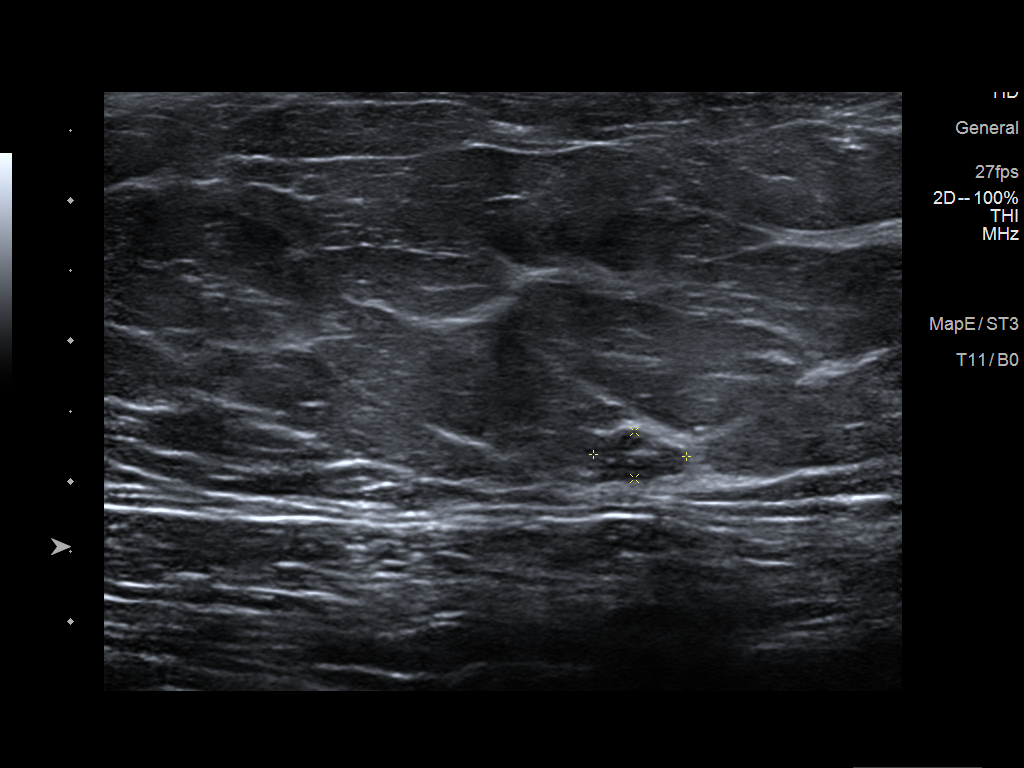

[6 of 6 positions shown; findings below may reference images not displayed]

ACR Breast Density Category b: There are scattered areas of
fibroglandular density.
FINDINGS: 2D/3D spot compression views of the LEFT breast demonstrate a
persistent 0.7 cm circumscribed oval mass within the posterior UPPER
LEFT breast.

Targeted ultrasound is performed, showing a 0.7 x 0.3 x 0.7 cm
circumscribed oval hypoechoic parallel mass at the [DATE] position of
the LEFT breast 5 cm from the nipple, corresponding to the screening
study finding.
IMPRESSION: 1. 0.7 cm likely benign mass within the UPPER INNER LEFT breast,
corresponding to the screening study finding. Six-month follow-up
recommended to ensure stability.

RECOMMENDATION:
LEFT breast ultrasound in 6 months.

I have discussed the findings and recommendations with the patient.
If applicable, a reminder letter will be sent to the patient
regarding the next appointment.

BI-RADS CATEGORY  3: Probably benign.

## 2021-12-22 ENCOUNTER — Other Ambulatory Visit: Payer: Self-pay

## 2021-12-22 ENCOUNTER — Encounter: Payer: 59 | Attending: Surgery | Admitting: Skilled Nursing Facility1

## 2021-12-22 ENCOUNTER — Encounter: Payer: Self-pay | Admitting: Skilled Nursing Facility1

## 2021-12-22 DIAGNOSIS — E669 Obesity, unspecified: Secondary | ICD-10-CM | POA: Insufficient documentation

## 2021-12-22 NOTE — Progress Notes (Signed)
Nutrition Assessment for Bariatric Surgery Medical Nutrition Therapy Appt Start Time: 7:18    End Time: 8:25  Patient was seen on 12/22/2021 for Pre-Operative Nutrition Assessment. Letter of approval faxed to [facility] bariatric surgery program coordinator on 12/22/2021.   Referral stated Supervised Weight Loss (SWL) visits needed: 0  Pt completed visits.   Pt has cleared nutrition requirements.   Planned surgery: sleeve gastrectomy  Pt expectation of surgery: increased strength and flexibility  Pt expectation of dietitian: none stated    NUTRITION ASSESSMENT   Anthropometrics  Start weight at [facility]: 301.5 lbs (date: 12/22/2021)  Height: 66.5 in BMI: 47.93 kg/m2     Clinical  Medical hx: prediabetes  Medications: N/A  Labs: A1C 5.8, Vitamin D 26.6 Notable signs/symptoms: constipation when working her work week Any previous deficiencies? Yes: vitamin D  Micronutrient Nutrition Focused Physical Exam: Hair: No issues observed Eyes: No issues observed Mouth: No issues observed Neck: No issues observed Nails: No issues observed Skin: No issues observed  Lifestyle & Dietary Hx  Pt states she works 84 hours a week with a week on and off.   Pt states she does not tolerate almonds.  Pt states she does realize she does eat to stay awake.  Pt states her work is time consuming as well as her 3 boys schedule.    24-Hr Dietary Recall First Meal: yogurt + 2 cheese slices Snack:  Second Meal: chicken + vegetables  Snack:  Third Meal: chicken wrap with lettuce and tomato and bacon Snack:  Beverages: diet soda, coffee + sugar free creamer, water + flavoring, water, suagr free gatorade   Estimated Energy Needs Calories: 1500   NUTRITION DIAGNOSIS  Overweight/obesity (-3.3) related to past poor dietary habits and physical inactivity as evidenced by patient w/ planned sleeve gastrectomy surgery following dietary guidelines for continued weight loss.     NUTRITION INTERVENTION  Nutrition counseling (C-1) and education (E-2) to facilitate bariatric surgery goals.  Educated pt on micronutrient deficiencies post surgery and strategies to mitigate that risk   Pre-Op Goals Reviewed with the Patient Track food and beverage intake (pen and paper, MyFitness Pal, Baritastic app, etc.) Make healthy food choices while monitoring portion sizes Consume 3 meals per day or try to eat every 3-5 hours Avoid concentrated sugars and fried foods Keep sugar & fat in the single digits per serving on food labels Practice CHEWING your food (aim for applesauce consistency) Practice not drinking 15 minutes before, during, and 30 minutes after each meal and snack Avoid all carbonated beverages (ex: soda, sparkling beverages)  Limit caffeinated beverages (ex: coffee, tea, energy drinks) Avoid all sugar-sweetened beverages (ex: regular soda, sports drinks)  Avoid alcohol  Aim for 64-100 ounces of FLUID daily (with at least half of fluid intake being plain water)  Aim for at least 60-80 grams of PROTEIN daily Look for a liquid protein source that contains ?15 g protein and ?5 g carbohydrate (ex: shakes, drinks, shots) Make a list of non-food related activities Physical activity is an important part of a healthy lifestyle so keep it moving! The goal is to reach 150 minutes of exercise per week, including cardiovascular and weight baring activity. Try breaking your pt care work flow into blocks and have eating/drinking in between those times Carry a book bag with you with your drinks and snacks in it Protein bar: double digits fiber, double digits protein, single digits sugar, single digits fat  *Goals that are bolded indicate the pt would like  to start working towards these  Handouts Provided Include  Bariatric Surgery handouts (Nutrition Visits, Pre-Op Goals, Protein Shakes, Vitamins & Minerals)  Learning Style & Readiness for Change Teaching method utilized:  Visual & Auditory  Demonstrated degree of understanding via: Teach Back  Readiness Level: action Barriers to learning/adherence to lifestyle change: work schedule   MONITORING & EVALUATION Dietary intake, weekly physical activity, body weight, and pre-op goals reached at next nutrition visit.    Next Steps  Patient is to follow up at [facility] for Pre-Op Class >2 weeks before surgery for further nutrition education.   Pt has completed visits. No further supervised visits required/recomended

## 2022-01-17 ENCOUNTER — Ambulatory Visit (HOSPITAL_COMMUNITY)
Admission: RE | Admit: 2022-01-17 | Discharge: 2022-01-17 | Disposition: A | Discharge status: Home / Self Care | Payer: 59 | Source: Ambulatory Visit | Attending: Surgery | Admitting: Surgery

## 2022-01-17 ENCOUNTER — Other Ambulatory Visit: Payer: Self-pay

## 2022-01-17 ENCOUNTER — Other Ambulatory Visit (HOSPITAL_COMMUNITY): Payer: Self-pay | Admitting: Surgery

## 2022-01-17 ENCOUNTER — Ambulatory Visit (HOSPITAL_COMMUNITY): Payer: 59

## 2022-01-17 DIAGNOSIS — I498 Other specified cardiac arrhythmias: Secondary | ICD-10-CM | POA: Insufficient documentation

## 2022-01-21 ENCOUNTER — Ambulatory Visit (INDEPENDENT_AMBULATORY_CARE_PROVIDER_SITE_OTHER): Payer: 59 | Admitting: Psychology

## 2022-01-21 NOTE — Progress Notes (Addendum)
Date of Evaluation: 01/21/2022 Time Seen: 9am Duration: 60 minutes Type of Session: Inittial Appointment for Evaluation  Location of Patient: Home (private location) Location of Provider: At home in a private office due to COVID-19 pandemic Type of Contact: WebEx video visit with audio  Prior to proceeding with today's appointment, two pieces of identifying information were obtained from [name] to verify identity. In addition, Audrey Rice's physical location at the time of this appointment was obtained. In the event of technical difficulties, Audrey Rice shared a phone number they could be reached at. Audrey Rice and this provider participated in today's telepsychological service. Audrey Rice denied anyone else being present in the room or on the virtual appointment.The provider's role was explained to Audrey Rice. The provider reviewed and discussed issues of confidentiality, privacy, and limits therein (e.g., reporting obligations). In addition to verbal informed consent, written informed consent for psychological services was obtained from Audrey Rice prior to the initial intake interview. Written consent included information concerning the practice, financial arrangements, and confidentiality and patients' rights. Since the clinic is not a 24/7 crisis center, mental health emergency resources were shared, and the provider explained MyChart, e-mail, voicemail, and/or other messaging systems should be utilized only for non-emergency reasons. This provider also explained that information obtained during appointments will be placed in their electronic medical chart in a confidential manner. Audrey Rice verbally acknowledged understanding of the aforementioned and agreed to use mental health emergency resources discussed if needed. Moreover, Audrey Rice agreed information may be shared with other [name] Rice providers as needed for coordination of care. By signing the new patient documents, Audrey Rice provided  written consent for coordination of care. Audrey Rice verbally acknowledged understanding she is ultimately responsible for understanding her insurance benefits as it relates to reimbursement of telepsychological and in-person services. This provider also reviewed confidentiality, as it relates to telepsychological services, as well as the rationale for telepsychological services. More specifically, this provider's clinic is providing telepsychological services to reduce exposure to COVID-19. The patient expressed understanding regarding the rationale for telepsychological services. Audrey Rice verbally consented to proceed.  Audrey Rice completed the psychiatric diagnostic evaluation (complete history, including past, family, and social history, psychiatric history, and establishment of a tentative diagnosis) and the bariatric assessment for a total of 60 minutes. Code [phone] was billed.   Additionally, during this initial appointment this provider completed the scoring of the following measures: Beck Anxiety Inventory (BAI) (5 minutes), Becks Depression Index (5 minutes), Eating Disorder Diagnostic Scale (EDDS) (10 minutes [administration and scoring]), Mini Mental Status Examination (MMSE) (10 minutes [administration and scoring]), & Mood Disorder Questionnaire (MDQ) (5 minutes [administration and scoring]). A total of 35 minutes was spent on the scoring of the aforementioned measures and code [phone] was billed.   Mental Status Examination:  Appearance:  neat Behavior: appropriate to circumstances Mood: euthymic Affect: mood congruent Speech: WNL Eye Contact: appropriate Psychomotor Activity: WNL Thought Process: linear, logical, and goal directed and denies suicidal, homicidal, and self-harm ideation, plan and intent Content/Perceptual Disturbances: none Orientation: AAOx4 Cognition/Sensorium: intact Insight: good Judgment: good  Time Requirements: Interview: 60 minutes (billing code [phone]) Assessment  scoring and interpreting: 35 minutes (billing code [phone])  DSM-5 Diagnosis(es) Code: E66.01 Morbid Obesity  Plan: Audrey Rice is currently scheduled for a feedback appointment on 02/02/2022 at [place] [facility] via WebEx video visit with audio.    Bariatric Evaluation    CONFIDENTIAL    [name]  MRN: [id number] Date of Birth:  [birth year]                                                             Date of Evaluation: 01/21/2022 Total Assessment Time:  Minutes                                              Date of Report:  Evaluator: Audrey Rice, Psy.D.                                 Referring Physician: Dr. Johnathan Hausen, Audrey Rice  Report writing: 125 total minutes. 01/16/2022: 12:05-12:40pm. 01/21/2022: 11:50-12pm. 01/24/2022: 2:05-2:45pm and 5:35-5:50pm. 01/25/2022: 5:15-5:35pm and 7:20-7:25pm.   Reason for Referral: Audrey Rice reported she was referred for an evaluation to make sure she is appropriate for Rice. This Probation officer explained his role and she verbally consented to proceed. Per referral paperwork, she is very well-informed [about bariatric Rice], her husband is very motivated and very supportive and has a very active and very fit, and she would be a good candidate for sleeve gastrectomy.  Sources of Information Clinical Interview Bariatric Questionnaire [place] Interview for Gastric Bypass Beck Anxiety Inventory (BAI) Beck Depression Inventory, 2nd Edition (BDI-II) Eating Disorder Diagnostic Scale (EDDS) Mini-Mental State Examination (MMSE)  Mood Disorder Questionnaire (MDQ) Review of Medical Record (provided by [facility])  Patient Identification and Chief Complaint: Audrey Rice currently resides in [place], [place], noting she lives with her husband and three children whom she gets along with. She stated she obtained a [name]. She denied a history of learning diagnosis or grade retentions. Audrey Rice reported she is  currently employed full-time as a Psychologist, educational for [employer].   Audrey Rice discussed a belief weight loss may help with reducing aches, feeling better, and doing more. She shared her social support system consists of various immediate family members (e.g., mother, stepfather, and husband). Ms. Mohammad reported a belief there would be a positive impact on her relationships if she were to lose weight, adding it would allow her to better engage in physical activities with others. She described herself and her husband as the primary cooks in the house.    Ms. Libman and referral paperwork reported her medical and surgical history is unremarkable. Her family medical history is significant for thyroid disease (mother and father), ulcerative colitis (mother), and diabetes mellitus (grandmother). Ms. Balch reported she is medication compliant and does not have any issues with her current medications.   Current Medications: Metformin 500MG  Magnesium Vitamin B12  Ms. Koffler discussed having been diagnosed with depression and prescribed Wellbutin while going through a [facility] weight loss program that included use of mental health services, although added she feels more anxiety than depression but experiences a low mood during employment weeks (she noted working seven days on and seven days off schedule that includes 12 hour work days). She denied her anxiety and low mood have a significant impact on her daily functioning, although noted she engaged in emotional eating-related behaviors when anxious or fatigued. She expressed awareness of the importance of ceasing  emotional eating, noting since December of 2022 she downloaded a protein tracker app and has been increasingly utilizing protein shakes and bars instead of eating unhealthy food options in the physicians lounge. She denied use of all recreational or illicit substances as well as ever experiencing suicidal ideation, plan, or intent; panic attacks;  abuse or neglect; or psychiatric hospitalization. She also denied awareness of any familial mental health history.   Patients Understanding of the Procedure/Risks of Rice: Ms. Ganesh shared knowing of others that had bariatric Rice, adding she is aware of the negative repercussions from her employment as she sees people being hospitalized for various issues after bariatric Rice. She stated she is pursuing a gastric sleevectomy which involves an upper endoscopy to examine, and then cutting part of your stomach off and making it the shape of a banana. She described awareness of anesthesia being administered during the procedure and noted the Rice can cause infection, leak, blood loss, acid reflux, death, dumping syndrome, nausea, vomiting, diarrhea, nutritional deficiencies, and behavioral changes. She reported there would be a postoperative hospital stay and she would be unable to return to work/daily activities for two-to-three weeks. She reported her primary motivations for the procedure include increased day-to-day mobility and improved health. She stated she is primarily pursuing bariatric Rice to be more mobile and stronger, although noted she would like to lose 100 pounds following the Rice. She also stated maximum weight loss can take years.  Ms. Sieh reported confidence in her ability to implement food restrictions following gastric Rice, noting she is working to eat more consistently by setting an alarm and taking break at least a few times during her work day to eat, generally does not eat a lot when she eats, utilizing protein drinks and protein bars, weightlifting, increasing her water intake by using visual reminders (e.g., leaving water bottles out), not engaging in activities that could distract her while eating (e.g., watching TV) as well as being mindful of how much she is chewing her food, and limiting her access to inappropriate food options. She expressed confidence  in her ability to obtain and prepare food. She reported a belief her living environment would assist her in her attempts to control her eating. Following Rice, she reported awareness she will be primarily consuming liquids and portion sizes will be small. When able to start eating solid foods, she indicated she would have to consume high protein options, vegetables, fruits, and lean meats (e.g., chicken and seafood). Ms. Lok reported awareness she would have to avoid carbonated beverages, alcohol, high carbohydrate options, sugary options, and heavy meats (e.g., steak). Optimally, she indicated she would consume a small meal or snack every three-to-four hours, chew her food to mushy consistency and it is disintegrating in your mouth, and not consume beverages 15 minutes before or after a meal.   History of Weight Gains and Losses: Ms. Knape described being fairly active, noting she is mobile during family events (e.g., soccer games), walks, and goes to the gym. She described her past weight loss efforts as including a [facility] weight loss program that included use of Vyvanse, phentermine, metformin, and Wellbutrin as well as mental health services and a personal trainer (helpful); Optifast from 2015-2020 (loss 70lbs - kept it off for 4-4.5 years); super restrictive diet in 2021 (loss 30lbs); and past (not helpful) and current use of nutritional services (helpful).     Eating Behaviors: Ms. Brosious denied a history of binge- and purge-related behaviors, although noted approximately once monthly  overeating-related behaviors. She reported eating very quickly while at her employment as she never knows when she will have to leave her meal, eating when not physically hungry approximately two-to-three times a week secondary to fatigue and stress, drinking three 12oz Diet Cokes daily, using multivitamins and minerals daily, and drinking up to 60oz of water daily. She expressed awareness of the importance of  ceasing rapid- and emotional eating-related behaviors as well as use of carbonated beverages, noting she has been reducing these behaviors by having smaller portions of foods available and taking more breaks at work, being mindful of her soda consumption, and increasing her water intake by using sugar free water enhances and visual reminders.   Current Diet and Plans for the Future:  Breakfast Triple zero yogurt and cheese or egg burrito.  Lunch Whatever is in the physician office or a sandwich with chips. Dinner Varies depending upon how busy events are with [her] children. Snacks   Mental Status Examination: Ms. Will presented on time to the session and completed all the necessary paperwork. She was dressed casually, and her hygiene was observed to be good. Ms. Kindley was oriented to person, place, time, and purpose of appointment. Her attitude was cooperative and cheerful. There were no unusual psychomotor movements or changes. Speech patterns were normal in rate, tone, volume, and without pressure. Affect was reactive and mood congruent. Thought processes were goal-directed and logical. Insight and judgement were good. The Mini-Mental State Examination (MMSE) was administered. She scored a 30/30, which is indicative of normal cognitive functioning.    Psychological Functioning: Ms. Ehresman completed the Mood Disorder Questionnaire (MDQ). She scored a 2/13 and denied several of the endorsed symptoms have occurred during the same period or have caused any problems, which is a negative screening for bipolar-related disorder. On the [place] (BAI), Ms. Ashenfelter scored an 18/63, which is indicative of moderate anxiety symptomatology. On the Beck Depression Inventory (BDI-II), Ms. Rivenbark scored a 22/63, which is indicative of moderate depression. She also endorsed thoughts of killing herself but that she would not carry them out. Upon follow-up, she noted her answers were likely during a work week  and her mood is significantly improved on weeks she is not working. She described her endorsed suicidal ideation as passive, noting it is infrequent and fleeting. She denied ever having suicidal plan or intent, and expressed awareness of the importance of reaching out to trusted others and/or emergency services as well as confidence in her ability to keep herself safe. The Eating Disorder Diagnostic Scale (EDDS) was administered. Ms. Hinchman indicated her weight and shape have impacted how she views herself as a person. She endorsed eating an unusually large amount of food and experiencing a loss of control approximately twelve times per week over the past three months, noting during these periods she eats more rapidly than normal, eats until uncomfortably full, and feels negatively about the resulting weight gain or overeating. Upon follow-up, she noted this was in reference to eating quickly at nearly every meal which could contribute to overeating if she has not portioned it out. She denied experiencing any other problematic eating behaviors.   Conclusions & Recommendations: Ms. Haeven Nickle is a 46 year old female who was referred to the [place] division by Dr. Johnathan Hausen at [facility] for a psychological evaluation to determine her suitability for bariatric Rice.   Regarding bariatric surgery, Ms. Leard appears to be highly motivated and has a good understanding of the bariatric  Rice as well as risks and lifestyle changes needed to promote post-surgical weight loss success. Results of this evaluation yielded a history of depression and anxiety, infrequent passive suicidal ideation, regular rapid eating- and occasional emotional eating-related behaviors, and daily use of soda. She expressed awareness of the importance of managing her mental health as well as ceasing problematic eating behaviors and soda use, noting successful efforts to do so. She noted  confidence in her ability to follow all dietary requirements. This Probation officer recommended she utilize mental health services should her mental health concerns become exacerbated, she experiences decreased confidence in her ability to keep herself safe, or she encounters difficulties in implementing dietary requirements. She noted being agreeable to doing so.  At this time, Ms. Perren appears to be able to make an informed decision about the Rice she is contemplating. She appears to be motivated and expressed understanding of the post-surgical requirements. If Ms. Vanns Rice is scheduled more than three months from todays date (01/21/2022), she is required to schedule a follow-up visit for this examiner to briefly re-evaluate her psychological status at that time. This follow-up visit should occur within two months of her scheduled Rice date.  The following recommendations are offered to promote Ms. Vanns health and well-being:  1. It is recommended Ms. Vera participate in educational sessions regarding a healthy diet and post-operative meal planning with a dietician or other health care providers.   2. Re-evaluation. If Ms. Vanns Rice is scheduled more than three months from her date of approval, she is required to contact our office [phone]) for a brief check-in appointment within two months from her Rice date.   3. Nutritional Counseling. Ms. Lumadue is strongly encouraged to continue attending nutritional counseling appointments in order to plan a healthy diet and post-operative meals. She is encouraged to make recommended changes to her diet prior to Rice in order to increase the chances of continuing a healthy diet after Rice.   4. Exercise. Ms. Villagomez is encouraged to participate in educational sessions on exercise that will be appropriate for her medical conditions and support her weight loss plans in a safe and healthy manner. Specifically, she is encouraged to consider  participating in the Bariatric Exercise and Lifestyle Transformation (BELT) program, a partnership between [place]. There, she will join fellow [facility] bariatric patients three times a week for personalized aerobic and strength training instruction, as well as educational sessions on diet, exercise and behavior modification strategies. BELT meets at [place] at [employer].  LegalPaid.ch  5. Support groups. Ms. Pottenger is encouraged to join a support group to give her encouragement as she faces the psychological adjustments of bariatric Rice and the need to significantly adjust ones meals and food choices. A list of support groups offered through [facility] can be found through the bariatrics department website: MetroMeds.nl  6. Self-help resources.  a. To develop strategies for managing emotional difficulties encountered before and after weight loss Rice, the patient is encouraged to read The Emotional First + Aid Kit: A Practical Guide to Life After Bariatric Rice, Second Edition by Caren Griffins L. Sheppard Coil, PsyD. Examples of strategies discussed in this book include relieving stress without using food, developing and maintaining an exercise program, preventing relapse, etc. b. Ms. Street is strongly encouraged to practice mindful eating, the goal of which is to pay close attention to the smell, sight, taste, temperature, texture, etc. of food. Eating mindfully helps to eat slower while  enjoying food more fully. Useful books on mindful eating include Savor: Mindful Eating, Mindful Life and How to Eat, both by mindfulness expert Thich Nhat Hanh.  7. Mental health treatment. For additional support either prior to or after the surgery, Ms. Beachem may consider seeking the care of a therapist (counselor, psychologist) in order to develop skills  for coping with the adjustment to a new lifestyle. Available therapists include other clinicians within the [facility] [phone]). She may also seek other in-network providers in the community by searching online at DustingSprays.pl.   8. General recommendations for bariatric Rice: a. Replace the habit of late-night snacking with something else (e.g., chewing gum, drinking water, or a relaxing activity like reading or crosswords that occupies your hands) and consider going to bed earlier.  b. Practice eating 4-6 meals per day. Each meal should last about 20 minutes. c. Practice drinking liquids 30 minutes before or after meals. d. Keep a food diary for 1 week. Record all foods eaten during the day, including snacks and drinks. Be very specific and very honest.  e. Get into the habit of reading food labels to evaluate content of protein, sugars, carbohydrates, sodium, etc.  f. Continue to eat lots of vegetables.  g. Prepare meals at home, rather than take-out or fast food.  h. Take multivitamins including zinc and iron.  i. Develop exercise plans, including a low-impact and safe exercise plan to start 4-5 weeks into recovery, and a more intensive exercise plan for later.  j. Determine who will take care of any major responsibilities (particularly those involving physical activity, such as childcare) in the early stages of your recovery.  k. Educate family and friends who will be involved in your recovery about the extent and importance of your new lifestyle changes. The more they know, the better they can support you and help you stay on track!                  Dolores Lory, PsyD

## 2022-02-02 ENCOUNTER — Ambulatory Visit (INDEPENDENT_AMBULATORY_CARE_PROVIDER_SITE_OTHER): Payer: 59 | Admitting: Psychology

## 2022-02-02 DIAGNOSIS — F509 Eating disorder, unspecified: Secondary | ICD-10-CM | POA: Diagnosis not present

## 2022-02-02 NOTE — Progress Notes (Signed)
Date of Appointment: 02/02/2022   ?Time Seen: 9am ?Duration: 20 minutes (10 minute feedback and 10 minute report writing) ?Type of Session: Follow-up Appointment for Evaluation  ?Location of Patient: Parked in [place] (private/safe location) ?Location of Provider: At home in a private office due to COVID-19 pandemic ?Type of Contact: WebEx video visit with audio ? ?Audrey Rice participated in the session via WebEx video visit with audio, from the privacy of their home due to the COVID-19 pandemic. Audrey Rice provided verbal consent to proceed with today's appointment. This provider participated from a private home office. Today's interactive feedback session was completed which included reviewing the following measures: Beck Anxiety Inventory (BAI), Beck?s Depression Index (BDI), Eating Disorder Diagnostic Scale (EDDS), Mental Status Examination (MSE), & Mood Disorder Questionnaire (MDQ). During this interactive feedback session, this provider and Audrey Rice discussed the results of the aforementioned measures, treatment recommendations, and the final determination regarding the psychological approval for bariatric surgery.  ? ?Please see the bariatric assessment, for additional details. This provider completed the written report which includes integration of patient data, interpretation of standardized test results, interpretation of clinical data, review of referral from surgeon & clinical decision making (125 minutes in total). ? ?The interactive feedback session was completed today and a total of 20 minutes was spent on feedback and report writing. No billing code for the feedback appointment. ? ?Mental Status Examination:  ?Appearance:  neat ?Behavior: appropriate to circumstances ?Mood: neutral ?Affect: mood congruent ?Speech: WNL ?Eye Contact: appropriate ?Psychomotor Activity: WNL ?Thought Process: linear, logical, and goal directed and denies suicidal, homicidal, and self-harm ideation, plan and intent ?Content/Perceptual  Disturbances: none ?Orientation: AAOx4 ?Cognition/Sensorium: intact ?Insight: good ?Judgment: good ? ?Time Requirements: ?Feedback: 9-9:20am (no billing code) ?Report writing: 125 total minutes. 01/16/2022: 12:05-12:40pm. 01/21/2022: 11:50-12pm. 01/24/2022: 2:05-2:45pm and 5:35-5:50pm. 01/25/2022: 5:15-5:35pm and 7:20-7:25pm.  (billing codes [phone] and [phone]) ? ?DSM-5 Diagnosis(es) code: E66.01 Morbid Obesity ? ?Plan: Audrey Rice provided verbal consent for her evaluation to be sent to [facility]. No further follow-up planned by this provider.   ? ? ? ? ? ? ? ? ? ? ? ? ? ? ? ? ? ?Dolores Lory, PsyD ?

## 2022-02-17 ENCOUNTER — Ambulatory Visit: Payer: Self-pay | Admitting: Surgery

## 2022-02-21 ENCOUNTER — Encounter: Payer: 59 | Attending: Surgery | Admitting: Skilled Nursing Facility1

## 2022-02-21 ENCOUNTER — Other Ambulatory Visit: Payer: Self-pay

## 2022-02-21 NOTE — Progress Notes (Signed)
Pre-Operative Nutrition Class:   ? ?Patient was seen on 02/21/2022 for Pre-Operative Bariatric Surgery Education at the [facility].   ? ?Surgery date: 03/21/2022 ?Surgery type: Sleeve ?Start weight at [facility]: 301.5  ?Weight today: 303.6 ? ?Samples given per MNT protocol. Patient educated on appropriate usage: ?Bariatric Advantage Multivitamin ?Lot # [id number] ?Exp:10/23 ?  ?Ensure max  Shake ?Lot # [id number] ?Exp: 03/05/2022 ?  ? ?The following the learning objectives were met by the patient during this course: ?Identify Pre-Op Dietary Goals and will begin 2 weeks pre-operatively ?Identify appropriate sources of fluids and proteins  ?State protein recommendations and appropriate sources pre and post-operatively ?Identify Post-Operative Dietary Goals and will follow for 2 weeks post-operatively ?Identify appropriate multivitamin and calcium sources ?Describe the need for physical activity post-operatively and will follow MD recommendations ?State when to call healthcare provider regarding medication questions or post-operative complications ?When having a diagnosis of diabetes understanding hypoglycemia symptoms and the inclusion of 1 complex carbohydrate per meal ? ?Handouts given during class include: ?Pre-Op Bariatric Surgery Diet Handout ?Protein Shake Handout ?Post-Op Bariatric Surgery Nutrition Handout ?BELT Program Information Flyer ?Support Group Information Flyer ?[facility] Bariatric Supplements Price List ? ?Follow-Up Plan: ?Patient will follow-up at [facility] 2 weeks post operatively for diet advancement per MD.  ?

## 2022-03-02 ENCOUNTER — Other Ambulatory Visit (HOSPITAL_COMMUNITY): Payer: Self-pay

## 2022-03-03 ENCOUNTER — Encounter (HOSPITAL_COMMUNITY): Payer: Self-pay

## 2022-03-03 ENCOUNTER — Other Ambulatory Visit: Payer: Self-pay

## 2022-03-03 NOTE — Progress Notes (Addendum)
Anesthesia Review: ? ?PCP:DR Rayond deCuba  ?Cardiologist : none  ?Chest x-ray :12/08/21  ?EKG : 01/17/22  ?Ct Card- 03/04/21  ?Echo : ?Stress test: ?Cardiac Cath :  ?Activity level: can do a flight of stairs without difficulty  ?Sleep Study/ CPAP : none  ?Fasting Blood Sugar :      / Checks Blood Sugar -- times a day:   ?Blood Thinner/ Instructions /Last Dose: ?ASA / Instructions/ Last Dose :   ? ? ?

## 2022-03-04 ENCOUNTER — Encounter (HOSPITAL_COMMUNITY): Payer: Self-pay

## 2022-03-04 NOTE — Progress Notes (Addendum)
DUE TO COVID-19 ONLY 2 VSITOR IS ALLOWED TO COME WITH YOU AND STAY IN THE WAITING ROOM ONLY DURING PRE OP AND PROCEDURE DAY OF SURGERY.  4 VISITOR  MAY VISIT WITH YOU AFTER SURGERY IN YOUR PRIVATE ROOM DURING VISITING HOURS ONLY! ?YOU MAY HAVE ONE PERSON SPEND THE NITE WITH YOU IN YOUR ROOM AFTER SURGERY.   ? ? ? Your procedure is scheduled on:  ?  03/21/2022  ? Report to [facility] Main  Entrance ? ? Report to admitting at          12 noon         ?DO NOT BRING INSURANCE CARD, PICTURE ID OR WALLET DAY OF SURGERY.  ?  ? ? Call this number if you have problems the morning of surgery [phone]  ? ? REMEMBER: NO  SOLID FOODS , CANDY, GUM OR MINTS AFTER MIDNITE THE NITE BEFORE SURGERY .       Marland Kitchen CLEAR LIQUIDS UNTIL       1115 am          DAY OF SURGERY.      PLEASE FINISH ENSURE DRINK PER SURGEON ORDER  WHICH NEEDS TO BE COMPLETED AT   1115 am      MORNING OF SURGERY.   ? ? ? ? ?CLEAR LIQUID DIET ? ? ?Foods Allowed      ?WATER ?BLACK COFFEE ( SUGAR OK, NO MILK, CREAM OR CREAMER) REGULAR AND DECAF  ?TEA ( SUGAR OK NO MILK, CREAM, OR CREAMER) REGULAR AND DECAF  ?PLAIN JELLO ( NO RED)  ?FRUIT ICES ( NO RED, NO FRUIT PULP)  ?POPSICLES ( NO RED)  ?JUICE- APPLE, WHITE GRAPE AND WHITE CRANBERRY  ?SPORT DRINK LIKE GATORADE ( NO RED)  ?CLEAR BROTH ( VEGETABLE , CHICKEN OR BEEF)                                                               ? ?    ? ?BRUSH YOUR TEETH MORNING OF SURGERY AND RINSE YOUR MOUTH OUT, NO CHEWING GUM CANDY OR MINTS. ?  ? ? Take these medicines the morning of surgery with A SIP OF WATER:  none  ? ? ?DO NOT TAKE ANY DIABETIC MEDICATIONS DAY OF YOUR SURGERY ?                  ?            You may not have any metal on your body including hair pins and  ?            piercings  Do not wear jewelry, make-up, lotions, powders or perfumes, deodorant ?            Do not wear nail polish on your fingernails.   ?           IF YOU ARE A FEMALE AND WANT TO SHAVE UNDER ARMS OR LEGS PRIOR TO SURGERY YOU MUST  DO SO AT LEAST 48 HOURS PRIOR TO SURGERY.  ?            Men may shave face and neck. ? ? Do not bring valuables to the hospital. [facility] NOT ?            RESPONSIBLE  FOR VALUABLES. ? Contacts, dentures or bridgework may not be worn into surgery. ? Leave suitcase in the car. After surgery it may be brought to your room. ? ?  ? Patients discharged the day of surgery will not be allowed to drive home. IF YOU ARE HAVING SURGERY AND GOING HOME THE SAME DAY, YOU MUST HAVE AN ADULT TO DRIVE YOU HOME AND BE WITH YOU FOR 24 HOURS. YOU MAY GO HOME BY TAXI OR UBER OR ORTHERWISE, BUT AN ADULT MUST ACCOMPANY YOU HOME AND STAY WITH YOU FOR 24 HOURS. ?  ? ?            Please read over the following fact sheets you were given: ?_____________________________________________________________________ ? ?[facility] - Preparing for Surgery ?Before surgery, you can play an important role.  Because skin is not sterile, your skin needs to be as free of germs as possible.  You can reduce the number of germs on your skin by washing with CHG (chlorahexidine gluconate) soap before surgery.  CHG is an antiseptic cleaner which kills germs and bonds with the skin to continue killing germs even after washing. ?Please DO NOT use if you have an allergy to CHG or antibacterial soaps.  If your skin becomes reddened/irritated stop using the CHG and inform your nurse when you arrive at Short Stay. ?Do not shave (including legs and underarms) for at least 48 hours prior to the first CHG shower.  You may shave your face/neck. ?Please follow these instructions carefully: ? 1.  Shower with CHG Soap the night before surgery and the  morning of Surgery. ? 2.  If you choose to wash your hair, wash your hair first as usual with your  normal  shampoo. ? 3.  After you shampoo, rinse your hair and body thoroughly to remove the  shampoo.                           4.  Use CHG as you would any other liquid soap.  You can apply chg directly  to the skin and  wash  ?                     Gently with a scrungie or clean washcloth. ? 5.  Apply the CHG Soap to your body ONLY FROM THE NECK DOWN.   Do not use on face/ open      ?                     Wound or open sores. Avoid contact with eyes, ears mouth and genitals (private parts).  ?                     Production manager,  Genitals (private parts) with your normal soap. ?            6.  Wash thoroughly, paying special attention to the area where your surgery  will be performed. ? 7.  Thoroughly rinse your body with warm water from the neck down. ? 8.  DO NOT shower/wash with your normal soap after using and rinsing off  the CHG Soap. ?               9.  Pat yourself dry with a clean towel. ?           10.  Wear clean pajamas. ?  11.  Place clean sheets on your bed the night of your first shower and do not  sleep with pets. ?Day of Surgery : ?Do not apply any lotions/deodorants the morning of surgery.  Please wear clean clothes to the hospital/surgery center. ? ?FAILURE TO FOLLOW THESE INSTRUCTIONS MAY RESULT IN THE CANCELLATION OF YOUR SURGERY ?PATIENT SIGNATURE_________________________________ ? ?NURSE SIGNATURE__________________________________ ? ?________________________________________________________________________  ? ? ?           ?

## 2022-03-08 ENCOUNTER — Encounter (HOSPITAL_COMMUNITY)
Admission: RE | Admit: 2022-03-08 | Discharge: 2022-03-08 | Disposition: A | Discharge status: Home / Self Care | Payer: 59 | Source: Ambulatory Visit | Attending: Surgery | Admitting: Surgery

## 2022-03-08 ENCOUNTER — Encounter (HOSPITAL_COMMUNITY): Payer: Self-pay

## 2022-03-08 ENCOUNTER — Other Ambulatory Visit: Payer: Self-pay

## 2022-03-08 VITALS — BP 124/89 | HR 80 | Temp 98.5°F | Resp 16 | Ht 67.0 in | Wt 302.0 lb

## 2022-03-08 DIAGNOSIS — Z01812 Encounter for preprocedural laboratory examination: Secondary | ICD-10-CM | POA: Insufficient documentation

## 2022-03-08 DIAGNOSIS — Z01818 Encounter for other preprocedural examination: Secondary | ICD-10-CM

## 2022-03-08 HISTORY — DX: Unspecified osteoarthritis, unspecified site: M19.90

## 2022-03-08 HISTORY — DX: Gastro-esophageal reflux disease without esophagitis: K21.9

## 2022-03-08 HISTORY — DX: Prediabetes: R73.03

## 2022-03-08 HISTORY — DX: Type 2 diabetes mellitus without complications: E11.9

## 2022-03-08 HISTORY — DX: Malignant (primary) neoplasm, unspecified: C80.1

## 2022-03-08 HISTORY — DX: Essential (primary) hypertension: I10

## 2022-03-08 HISTORY — DX: Acute embolism and thrombosis of unspecified deep veins of unspecified lower extremity: I82.409

## 2022-03-08 HISTORY — DX: Other specified postprocedural states: Z98.890

## 2022-03-08 LAB — CBC WITH DIFFERENTIAL/PLATELET
Abs Immature Granulocytes: 0.03 10*3/uL (ref 0.00–0.07)
Basophils Absolute: 0.1 10*3/uL (ref 0.0–0.1)
Basophils Relative: 1 %
Eosinophils Absolute: 0.1 10*3/uL (ref 0.0–0.5)
Eosinophils Relative: 2 %
HCT: 38.9 % (ref 36.0–46.0)
Hemoglobin: 12.8 g/dL (ref 12.0–15.0)
Immature Granulocytes: 0 %
Lymphocytes Relative: 24 %
Lymphs Abs: 1.7 10*3/uL (ref 0.7–4.0)
MCH: 28.1 pg (ref 26.0–34.0)
MCHC: 32.9 g/dL (ref 30.0–36.0)
MCV: 85.3 fL (ref 80.0–100.0)
Monocytes Absolute: 0.4 10*3/uL (ref 0.1–1.0)
Monocytes Relative: 6 %
Neutro Abs: 4.9 10*3/uL (ref 1.7–7.7)
Neutrophils Relative %: 67 %
Platelets: 331 10*3/uL (ref 150–400)
RBC: 4.56 MIL/uL (ref 3.87–5.11)
RDW: 14.1 % (ref 11.5–15.5)
WBC: 7.2 10*3/uL (ref 4.0–10.5)
nRBC: 0 % (ref 0.0–0.2)

## 2022-03-08 LAB — COMPREHENSIVE METABOLIC PANEL
ALT: 16 U/L (ref 0–44)
AST: 14 U/L — ABNORMAL LOW (ref 15–41)
Albumin: 4.1 g/dL (ref 3.5–5.0)
Alkaline Phosphatase: 74 U/L (ref 38–126)
Anion gap: 7 (ref 5–15)
BUN: 14 mg/dL (ref 6–20)
CO2: 25 mmol/L (ref 22–32)
Calcium: 9 mg/dL (ref 8.9–10.3)
Chloride: 106 mmol/L (ref 98–111)
Creatinine, Ser: 0.54 mg/dL (ref 0.44–1.00)
GFR, Estimated: >60 mL/min (ref >60)
Glucose, Bld: 106 mg/dL — ABNORMAL HIGH (ref 70–99)
Potassium: 4.2 mmol/L (ref 3.5–5.1)
Sodium: 138 mmol/L (ref 135–145)
Total Bilirubin: 0.3 mg/dL (ref 0.3–1.2)
Total Protein: 7.8 g/dL (ref 6.5–8.1)

## 2022-03-08 LAB — TYPE AND SCREEN
ABO/RH(D): A NEG
Antibody Screen: NEGATIVE

## 2022-03-20 NOTE — H&P (Signed)
?  PROVIDER: Joya San, MD ? ?MRN: [id number] ?DOB: 06/17/1976 ?DATE OF ENCOUNTER: 02/17/2022 ?Subjective  ? ?Chief Complaint: Bariatric pre-op ? ? ?History of Present Illness: ?Audrey Rice is a 46 y.o. female who is seen today for preop for her sleeve gastrectomy which is scheduled for April 17. I placed preoperative orders in epic. ? ?I reviewed her upper GI series which shows a tiny hiatal hernia. Otherwise normal. She has had no changes in her physical exam or history since I met with her and her husband for about having a sleeve gastrectomy. Answered her questions. She is going for dietary preop visit. ? 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 There are a lot of things in the hospitals that can certainly get his off of our diets and make it more difficult but she seems committed and determined.. ? ?Review of Systems: ?See HPI as well for other ROS. ? ?ROS  ? ?Medical History: ?History reviewed. No pertinent past medical history. ? ?Patient Active Problem List  ?Diagnosis  ? Impaired fasting glucose  ? Relies on partner vasectomy for contraception  ? Severe obesity (BMI >= 40) (CMS-HCC)  ? Well woman exam with routine gynecological exam  ? ?History reviewed. No pertinent surgical history.  ? ?No Known Allergies ? ?No current outpatient medications on file prior to visit.  ? ?No current facility-administered medications on file prior to visit.  ? ?Family History  ?Problem Relation Age of Onset  ? Thyroid disease Mother  ? Thyroid disease Father  ? ? ?Social History  ? ?Tobacco Use  ?Smoking Status Never  ?Smokeless Tobacco Never  ? ? ?Social History  ? ?Socioeconomic History  ? Marital status: Married  ?Tobacco Use  ? Smoking status: Never  ? Smokeless tobacco: Never  ?Vaping Use  ? Vaping Use: Never used  ?Substance and Sexual Activity  ? Alcohol use: Not Currently  ?  Drug use: Never  ? ?Objective:  ? ?Vitals:  ?02/17/22 1033  ?Weight: (!) 137.3 kg (302 lb 12.8 oz)  ?Height: 169.5 cm (5' 6.75")  ? ?Body mass index is 47.78 kg/m?. ? ?Physical Exam ?General: Obese white female who is pleasant and communicative--she has a good fund of knowledge and is ready to go down this pathway of surgery for weight loss ?HEENT glasses ?Lungs clear ?Heart SR  ?Abdomen  no surgery ? ?Labs, Imaging and Diagnostic Testing: ? ?Upper GI report showed tiny hiatal hernia. ? ?Assessment and Plan:  ?Diagnoses and all orders for this visit: ? ?Morbid (severe) obesity due to excess calories (CMS-HCC) ? ? ? ?Orders placed for her sleeve gastrectomy in April. If she has any further questions she can certainly contact me before and we can go from there. ? ? ? ?Kavan Devan Donia Pounds, MD  ?  ?

## 2022-03-21 ENCOUNTER — Inpatient Hospital Stay (HOSPITAL_COMMUNITY): Payer: 59 | Admitting: Anesthesiology

## 2022-03-21 ENCOUNTER — Other Ambulatory Visit: Payer: Self-pay

## 2022-03-21 ENCOUNTER — Inpatient Hospital Stay (HOSPITAL_COMMUNITY): Payer: 59 | Admitting: Physician Assistant

## 2022-03-21 ENCOUNTER — Inpatient Hospital Stay (HOSPITAL_COMMUNITY)
Admission: RE | Admit: 2022-03-21 | Discharge: 2022-03-22 | DRG: 621 | Disposition: A | Discharge status: Home / Self Care | Payer: 59 | Source: Ambulatory Visit | Attending: Surgery | Admitting: Surgery

## 2022-03-21 ENCOUNTER — Encounter (HOSPITAL_COMMUNITY)
Admission: RE | Disposition: A | Discharge status: Home / Self Care | Payer: Self-pay | Source: Ambulatory Visit | Attending: Surgery

## 2022-03-21 ENCOUNTER — Encounter (HOSPITAL_COMMUNITY): Payer: Self-pay | Admitting: Surgery

## 2022-03-21 DIAGNOSIS — F418 Other specified anxiety disorders: Secondary | ICD-10-CM

## 2022-03-21 DIAGNOSIS — Z9884 Bariatric surgery status: Principal | ICD-10-CM

## 2022-03-21 DIAGNOSIS — Z6841 Body Mass Index (BMI) 40.0 and over, adult: Secondary | ICD-10-CM

## 2022-03-21 DIAGNOSIS — K449 Diaphragmatic hernia without obstruction or gangrene: Secondary | ICD-10-CM | POA: Diagnosis not present

## 2022-03-21 DIAGNOSIS — Z8349 Family history of other endocrine, nutritional and metabolic diseases: Secondary | ICD-10-CM | POA: Diagnosis not present

## 2022-03-21 HISTORY — PX: UPPER GI ENDOSCOPY: SHX6162

## 2022-03-21 HISTORY — DX: Depression, unspecified: F32.A

## 2022-03-21 HISTORY — DX: Anxiety disorder, unspecified: F41.9

## 2022-03-21 LAB — CBC
HCT: 38 % (ref 36.0–46.0)
Hemoglobin: 11.9 g/dL — ABNORMAL LOW (ref 12.0–15.0)
MCH: 27.9 pg (ref 26.0–34.0)
MCHC: 31.3 g/dL (ref 30.0–36.0)
MCV: 89 fL (ref 80.0–100.0)
Platelets: 305 10*3/uL (ref 150–400)
RBC: 4.27 MIL/uL (ref 3.87–5.11)
RDW: 14.6 % (ref 11.5–15.5)
WBC: 17.1 10*3/uL — ABNORMAL HIGH (ref 4.0–10.5)
nRBC: 0 % (ref 0.0–0.2)

## 2022-03-21 LAB — HEMOGLOBIN AND HEMATOCRIT, BLOOD
HCT: 35.3 % — ABNORMAL LOW (ref 36.0–46.0)
Hemoglobin: 11.7 g/dL — ABNORMAL LOW (ref 12.0–15.0)

## 2022-03-21 LAB — CREATININE, SERUM
Creatinine, Ser: 0.78 mg/dL (ref 0.44–1.00)
GFR, Estimated: >60 mL/min (ref >60)

## 2022-03-21 LAB — PREGNANCY, URINE: Preg Test, Ur: NEGATIVE

## 2022-03-21 LAB — ABO/RH: ABO/RH(D): A NEG

## 2022-03-21 SURGERY — XI ROBOTIC GASTRIC SLEEVE RESECTION
Anesthesia: General | Site: Esophagus

## 2022-03-21 MED ORDER — CHLORHEXIDINE GLUCONATE 0.12 % MT SOLN
15.0000 mL | Freq: Once | OROMUCOSAL | Status: AC
  Administered 2022-03-21: 15 mL via OROMUCOSAL

## 2022-03-21 MED ORDER — BUPIVACAINE LIPOSOME 1.3 % IJ SUSP
INTRAMUSCULAR | Status: AC
  Filled 2022-03-21: qty 20

## 2022-03-21 MED ORDER — BUPIVACAINE LIPOSOME 1.3 % IJ SUSP
INTRAMUSCULAR | Status: DC | PRN
  Administered 2022-03-21: 20 mL

## 2022-03-21 MED ORDER — MORPHINE SULFATE (PF) 2 MG/ML IV SOLN: 1.0000 mg | INTRAVENOUS | Status: DC | PRN

## 2022-03-21 MED ORDER — FENTANYL CITRATE PF 50 MCG/ML IJ SOSY
25.0000 ug | PREFILLED_SYRINGE | INTRAMUSCULAR | Status: DC | PRN
  Administered 2022-03-21: 25 ug via INTRAVENOUS
  Administered 2022-03-21: 50 ug via INTRAVENOUS

## 2022-03-21 MED ORDER — ONDANSETRON HCL 4 MG/2ML IJ SOLN
INTRAMUSCULAR | Status: AC
  Administered 2022-03-21: 4 mg via INTRAVENOUS
  Filled 2022-03-21: qty 2

## 2022-03-21 MED ORDER — ACETAMINOPHEN 500 MG PO TABS
1000.0000 mg | ORAL_TABLET | Freq: Three times a day (TID) | ORAL | Status: DC
  Administered 2022-03-21 – 2022-03-22 (×3): 1000 mg via ORAL
  Filled 2022-03-21 (×3): qty 2

## 2022-03-21 MED ORDER — FENTANYL CITRATE (PF) 250 MCG/5ML IJ SOLN
INTRAMUSCULAR | Status: AC
  Filled 2022-03-21: qty 5

## 2022-03-21 MED ORDER — PANTOPRAZOLE SODIUM 40 MG IV SOLR
40.0000 mg | Freq: Every day | INTRAVENOUS | Status: DC
  Administered 2022-03-21: 40 mg via INTRAVENOUS
  Filled 2022-03-21: qty 10

## 2022-03-21 MED ORDER — DEXAMETHASONE SODIUM PHOSPHATE 10 MG/ML IJ SOLN
INTRAMUSCULAR | Status: AC
  Filled 2022-03-21: qty 1

## 2022-03-21 MED ORDER — ENSURE MAX PROTEIN PO LIQD
2.0000 [oz_av] | ORAL | Status: DC
  Administered 2022-03-22 (×3): 2 [oz_av] via ORAL

## 2022-03-21 MED ORDER — SODIUM CHLORIDE (PF) 0.9 % IJ SOLN
INTRAMUSCULAR | Status: DC | PRN
  Administered 2022-03-21: 10 mL via INTRAVENOUS

## 2022-03-21 MED ORDER — OXYCODONE HCL 5 MG/5ML PO SOLN: 5.0000 mg | Freq: Four times a day (QID) | ORAL | Status: DC | PRN

## 2022-03-21 MED ORDER — BUPIVACAINE LIPOSOME 1.3 % IJ SUSP
20.0000 mL | Freq: Once | INTRAMUSCULAR | Status: DC
Start: 2022-03-21 — End: 2022-03-21

## 2022-03-21 MED ORDER — SODIUM CHLORIDE 0.9 % IV SOLN: 12.5000 mg | Freq: Four times a day (QID) | INTRAVENOUS | Status: DC

## 2022-03-21 MED ORDER — ONDANSETRON HCL 4 MG/2ML IJ SOLN
INTRAMUSCULAR | Status: AC
  Filled 2022-03-21: qty 2

## 2022-03-21 MED ORDER — HEPARIN SODIUM (PORCINE) 5000 UNIT/ML IJ SOLN
5000.0000 [IU] | Freq: Three times a day (TID) | INTRAMUSCULAR | Status: DC
  Administered 2022-03-21 – 2022-03-22 (×3): 5000 [IU] via SUBCUTANEOUS
  Filled 2022-03-21 (×3): qty 1

## 2022-03-21 MED ORDER — ACETAMINOPHEN 500 MG PO TABS
1000.0000 mg | ORAL_TABLET | ORAL | Status: AC
  Administered 2022-03-21: 1000 mg via ORAL
  Filled 2022-03-21: qty 2

## 2022-03-21 MED ORDER — APREPITANT 40 MG PO CAPS
40.0000 mg | ORAL_CAPSULE | ORAL | Status: AC
  Administered 2022-03-21: 40 mg via ORAL
  Filled 2022-03-21: qty 1

## 2022-03-21 MED ORDER — KETAMINE HCL 50 MG/5ML IJ SOSY
PREFILLED_SYRINGE | INTRAMUSCULAR | Status: AC
  Filled 2022-03-21: qty 5

## 2022-03-21 MED ORDER — ONDANSETRON HCL 4 MG/2ML IJ SOLN: 4.0000 mg | INTRAMUSCULAR | Status: DC | PRN

## 2022-03-21 MED ORDER — HEPARIN SODIUM (PORCINE) 5000 UNIT/ML IJ SOLN
5000.0000 [IU] | INTRAMUSCULAR | Status: AC
  Administered 2022-03-21: 5000 [IU] via SUBCUTANEOUS
  Filled 2022-03-21: qty 1

## 2022-03-21 MED ORDER — PROPOFOL 10 MG/ML IV BOLUS
INTRAVENOUS | Status: AC
  Filled 2022-03-21: qty 20

## 2022-03-21 MED ORDER — PHENYLEPHRINE 40 MCG/ML (10ML) SYRINGE FOR IV PUSH (FOR BLOOD PRESSURE SUPPORT)
PREFILLED_SYRINGE | INTRAVENOUS | Status: DC | PRN
  Administered 2022-03-21: 80 ug via INTRAVENOUS

## 2022-03-21 MED ORDER — SODIUM CHLORIDE 0.9 % IV SOLN: 12.5000 mg | Freq: Four times a day (QID) | INTRAVENOUS | Status: DC | PRN

## 2022-03-21 MED ORDER — LACTATED RINGERS IV SOLN: INTRAVENOUS | Status: DC

## 2022-03-21 MED ORDER — SCOPOLAMINE 1 MG/3DAYS TD PT72
1.0000 | MEDICATED_PATCH | TRANSDERMAL | Status: DC
  Administered 2022-03-21: 1.5 mg via TRANSDERMAL
  Filled 2022-03-21: qty 1

## 2022-03-21 MED ORDER — SODIUM CHLORIDE 0.9 % IV SOLN
12.5000 mg | Freq: Four times a day (QID) | INTRAVENOUS | Status: DC | PRN
  Administered 2022-03-21: 12.5 mg via INTRAVENOUS
  Filled 2022-03-21: qty 12.5
  Filled 2022-03-21: qty 0.5

## 2022-03-21 MED ORDER — KETAMINE HCL 10 MG/ML IJ SOLN
INTRAMUSCULAR | Status: DC | PRN
Start: 2022-03-21 — End: 2022-03-21
  Administered 2022-03-21 (×5): 10 mg via INTRAVENOUS

## 2022-03-21 MED ORDER — ROCURONIUM BROMIDE 10 MG/ML (PF) SYRINGE
PREFILLED_SYRINGE | INTRAVENOUS | Status: AC
  Filled 2022-03-21: qty 10

## 2022-03-21 MED ORDER — STERILE WATER FOR IRRIGATION IR SOLN
Status: DC | PRN
  Administered 2022-03-21: 1000 mL

## 2022-03-21 MED ORDER — ONDANSETRON HCL 4 MG/2ML IJ SOLN: 4.0000 mg | Freq: Once | INTRAMUSCULAR | Status: AC | PRN

## 2022-03-21 MED ORDER — KCL IN DEXTROSE-NACL 20-5-0.45 MEQ/L-%-% IV SOLN
INTRAVENOUS | Status: DC
  Filled 2022-03-21 (×2): qty 1000

## 2022-03-21 MED ORDER — OXYCODONE HCL 5 MG/5ML PO SOLN: 5.0000 mg | Freq: Once | ORAL | Status: DC | PRN

## 2022-03-21 MED ORDER — LIDOCAINE 20MG/ML (2%) 15 ML SYRINGE OPTIME
INTRAMUSCULAR | Status: DC | PRN
  Administered 2022-03-21: 1.5 mg/kg/h via INTRAVENOUS

## 2022-03-21 MED ORDER — MIDAZOLAM HCL 2 MG/2ML IJ SOLN
INTRAMUSCULAR | Status: AC
  Filled 2022-03-21: qty 2

## 2022-03-21 MED ORDER — FENTANYL CITRATE (PF) 250 MCG/5ML IJ SOLN
INTRAMUSCULAR | Status: DC | PRN
  Administered 2022-03-21 (×3): 50 ug via INTRAVENOUS
  Administered 2022-03-21: 100 ug via INTRAVENOUS

## 2022-03-21 MED ORDER — LIDOCAINE 2% (20 MG/ML) 5 ML SYRINGE
INTRAMUSCULAR | Status: DC | PRN
  Administered 2022-03-21: 100 mg via INTRAVENOUS

## 2022-03-21 MED ORDER — AMISULPRIDE (ANTIEMETIC) 5 MG/2ML IV SOLN: 10.0000 mg | Freq: Once | INTRAVENOUS | Status: AC | PRN

## 2022-03-21 MED ORDER — LACTATED RINGERS IR SOLN
Status: DC | PRN
  Administered 2022-03-21: 1000 mL

## 2022-03-21 MED ORDER — ORAL CARE MOUTH RINSE: 15.0000 mL | Freq: Once | OROMUCOSAL | Status: AC

## 2022-03-21 MED ORDER — CHLORHEXIDINE GLUCONATE CLOTH 2 % EX PADS: 6.0000 | MEDICATED_PAD | Freq: Once | CUTANEOUS | Status: DC

## 2022-03-21 MED ORDER — MIDAZOLAM HCL 5 MG/5ML IJ SOLN
INTRAMUSCULAR | Status: DC | PRN
  Administered 2022-03-21: 2 mg via INTRAVENOUS

## 2022-03-21 MED ORDER — SUGAMMADEX SODIUM 500 MG/5ML IV SOLN
INTRAVENOUS | Status: AC
  Filled 2022-03-21: qty 5

## 2022-03-21 MED ORDER — ROCURONIUM BROMIDE 10 MG/ML (PF) SYRINGE
PREFILLED_SYRINGE | INTRAVENOUS | Status: DC | PRN
  Administered 2022-03-21: 90 mg via INTRAVENOUS

## 2022-03-21 MED ORDER — FENTANYL CITRATE PF 50 MCG/ML IJ SOSY
PREFILLED_SYRINGE | INTRAMUSCULAR | Status: AC
  Administered 2022-03-21: 25 ug via INTRAVENOUS
  Filled 2022-03-21: qty 2

## 2022-03-21 MED ORDER — SIMETHICONE 80 MG PO CHEW
80.0000 mg | CHEWABLE_TABLET | ORAL | Status: DC | PRN
  Administered 2022-03-21: 80 mg via ORAL
  Filled 2022-03-21: qty 1

## 2022-03-21 MED ORDER — DEXAMETHASONE SODIUM PHOSPHATE 10 MG/ML IJ SOLN
INTRAMUSCULAR | Status: DC | PRN
  Administered 2022-03-21: 10 mg via INTRAVENOUS

## 2022-03-21 MED ORDER — SODIUM CHLORIDE (PF) 0.9 % IJ SOLN
INTRAMUSCULAR | Status: AC
  Filled 2022-03-21: qty 10

## 2022-03-21 MED ORDER — ACETAMINOPHEN 160 MG/5ML PO SOLN: 1000.0000 mg | Freq: Three times a day (TID) | ORAL | Status: DC

## 2022-03-21 MED ORDER — ONDANSETRON HCL 4 MG/2ML IJ SOLN
INTRAMUSCULAR | Status: DC | PRN
  Administered 2022-03-21: 4 mg via INTRAVENOUS

## 2022-03-21 MED ORDER — SODIUM CHLORIDE 0.9 % IV SOLN
2.0000 g | INTRAVENOUS | Status: AC
  Administered 2022-03-21: 2 g via INTRAVENOUS
  Filled 2022-03-21: qty 2

## 2022-03-21 MED ORDER — OXYCODONE HCL 5 MG PO TABS: 5.0000 mg | ORAL_TABLET | Freq: Once | ORAL | Status: DC | PRN

## 2022-03-21 MED ORDER — METOPROLOL TARTRATE 5 MG/5ML IV SOLN: 5.0000 mg | Freq: Four times a day (QID) | INTRAVENOUS | Status: DC | PRN

## 2022-03-21 MED ORDER — AMISULPRIDE (ANTIEMETIC) 5 MG/2ML IV SOLN
INTRAVENOUS | Status: AC
  Administered 2022-03-21: 10 mg via INTRAVENOUS
  Filled 2022-03-21: qty 4

## 2022-03-21 MED ORDER — SODIUM CHLORIDE 0.9 % IR SOLN
Status: DC | PRN
  Administered 2022-03-21: 1000 mL

## 2022-03-21 MED ORDER — PROPOFOL 10 MG/ML IV BOLUS
INTRAVENOUS | Status: DC | PRN
  Administered 2022-03-21: 200 mg via INTRAVENOUS

## 2022-03-21 MED ORDER — SUGAMMADEX SODIUM 500 MG/5ML IV SOLN
INTRAVENOUS | Status: DC | PRN
  Administered 2022-03-21: 300 mg via INTRAVENOUS

## 2022-03-21 SURGICAL SUPPLY — 70 items
APPLIER CLIP 5 13 M/L LIGAMAX5 (MISCELLANEOUS)
APPLIER CLIP ROT 10 11.4 M/L (STAPLE)
BLADE SURG 15 STRL LF DISP TIS (BLADE) ×2 IMPLANT
BLADE SURG 15 STRL SS (BLADE) ×1
CANNULA REDUC XI 12-8 STAPL (CANNULA) ×1
CANNULA REDUCER 12-8 DVNC XI (CANNULA) ×2 IMPLANT
CHLORAPREP W/TINT 26 (MISCELLANEOUS) ×3 IMPLANT
CLIP APPLIE 5 13 M/L LIGAMAX5 (MISCELLANEOUS) IMPLANT
CLIP APPLIE ROT 10 11.4 M/L (STAPLE) IMPLANT
COVER SURGICAL LIGHT HANDLE (MISCELLANEOUS) ×3 IMPLANT
DERMABOND ADVANCED (GAUZE/BANDAGES/DRESSINGS) ×1
DERMABOND ADVANCED .7 DNX12 (GAUZE/BANDAGES/DRESSINGS) ×2 IMPLANT
DRAPE ARM DVNC X/XI (DISPOSABLE) ×8 IMPLANT
DRAPE COLUMN DVNC XI (DISPOSABLE) ×2 IMPLANT
DRAPE DA VINCI XI ARM (DISPOSABLE) ×4
DRAPE DA VINCI XI COLUMN (DISPOSABLE) ×1
ELECT REM PT RETURN 15FT ADLT (MISCELLANEOUS) ×3 IMPLANT
GLOVE BIO SURGEON STRL SZ 6 (GLOVE) ×9 IMPLANT
GLOVE BIO SURGEON STRL SZ8 (GLOVE) ×6 IMPLANT
GLOVE INDICATOR 6.5 STRL GRN (GLOVE) ×9 IMPLANT
GOWN STRL REUS W/ TWL XL LVL3 (GOWN DISPOSABLE) ×6 IMPLANT
GOWN STRL REUS W/TWL XL LVL3 (GOWN DISPOSABLE) ×3
GRASPER SUT TROCAR 14GX15 (MISCELLANEOUS) ×3 IMPLANT
IRRIG SUCT STRYKERFLOW 2 WTIP (MISCELLANEOUS) ×3
IRRIGATION SUCT STRKRFLW 2 WTP (MISCELLANEOUS) ×2 IMPLANT
KIT BASIN OR (CUSTOM PROCEDURE TRAY) ×3 IMPLANT
KIT TURNOVER KIT A (KITS) IMPLANT
LUBRICANT JELLY K Y 4OZ (MISCELLANEOUS) IMPLANT
MARKER SKIN DUAL TIP RULER LAB (MISCELLANEOUS) ×3 IMPLANT
MAT PREVALON FULL STRYKER (MISCELLANEOUS) ×3 IMPLANT
NDL SPNL 22GX3.5 QUINCKE BK (NEEDLE) ×2 IMPLANT
NEEDLE SPNL 22GX3.5 QUINCKE BK (NEEDLE) ×3 IMPLANT
OBTURATOR OPTICAL STANDARD 8MM (TROCAR) ×1
OBTURATOR OPTICAL STND 8 DVNC (TROCAR) ×2
OBTURATOR OPTICALSTD 8 DVNC (TROCAR) ×2 IMPLANT
PACK CARDIOVASCULAR III (CUSTOM PROCEDURE TRAY) ×3 IMPLANT
RELOAD STAPLE 60 2.5 WHT DVNC (STAPLE) IMPLANT
RELOAD STAPLE 60 3.5 BLU DVNC (STAPLE) IMPLANT
RELOAD STAPLER 2.5X60 WHT DVNC (STAPLE) ×12 IMPLANT
RELOAD STAPLER 3.5X60 BLU DVNC (STAPLE) ×2 IMPLANT
SCISSORS LAP 5X35 DISP (ENDOMECHANICALS) IMPLANT
SEAL CANN UNIV 5-8 DVNC XI (MISCELLANEOUS) ×6 IMPLANT
SEAL XI 5MM-8MM UNIVERSAL (MISCELLANEOUS) ×3
SEALER VESSEL DA VINCI XI (MISCELLANEOUS) ×1
SEALER VESSEL EXT DVNC XI (MISCELLANEOUS) ×2 IMPLANT
SLEEVE GASTRECTOMY 36FR VISIGI (MISCELLANEOUS) ×3 IMPLANT
SOL ANTI FOG 6CC (MISCELLANEOUS) ×2 IMPLANT
SOLUTION ANTI FOG 6CC (MISCELLANEOUS) ×1
SOLUTION ELECTROLUBE (MISCELLANEOUS) ×3 IMPLANT
SPIKE FLUID TRANSFER (MISCELLANEOUS) ×3 IMPLANT
SPONGE T-LAP 18X18 ~~LOC~~+RFID (SPONGE) ×3 IMPLANT
STAPLER 60 DA VINCI SURE FORM (STAPLE) ×1
STAPLER 60 SUREFORM DVNC (STAPLE) ×2 IMPLANT
STAPLER CANNULA SEAL DVNC XI (STAPLE) ×2 IMPLANT
STAPLER CANNULA SEAL XI (STAPLE) ×1
STAPLER RELOAD 2.5X60 WHITE (STAPLE) ×6
STAPLER RELOAD 2.5X60 WHT DVNC (STAPLE) ×12
STAPLER RELOAD 3.5X60 BLU DVNC (STAPLE) ×2
STAPLER RELOAD 3.5X60 BLUE (STAPLE) ×1
SUT ETHIBOND 0 36 GRN (SUTURE) IMPLANT
SUT MNCRL AB 4-0 PS2 18 (SUTURE) ×6 IMPLANT
SUT VICRYL 0 TIES 12 18 (SUTURE) ×3 IMPLANT
SYR 10ML ECCENTRIC (SYRINGE) IMPLANT
SYR 20ML LL LF (SYRINGE) ×3 IMPLANT
TOWEL OR 17X26 10 PK STRL BLUE (TOWEL DISPOSABLE) ×3 IMPLANT
TRAY FOLEY MTR SLVR 16FR STAT (SET/KITS/TRAYS/PACK) IMPLANT
TROCAR ADV FIXATION 5X100MM (TROCAR) IMPLANT
TROCAR BLADELESS OPT 5 100 (ENDOMECHANICALS) ×3 IMPLANT
TUBE CALIBRATION LAPBAND (TUBING) IMPLANT
TUBING INSUFFLATION 10FT LAP (TUBING) ×3 IMPLANT

## 2022-03-21 NOTE — Anesthesia Procedure Notes (Signed)
Procedure Name: Intubation ?Date/Time: 03/21/2022 1:49 PM ?Performed by: Jakaylee Sasaki D, CRNA ?Pre-anesthesia Checklist: Patient identified, Emergency Drugs available, Suction available and Patient being monitored ?Patient Re-evaluated:Patient Re-evaluated prior to induction ?Oxygen Delivery Method: Circle system utilized ?Preoxygenation: Pre-oxygenation with 100% oxygen ?Induction Type: IV induction ?Ventilation: Mask ventilation without difficulty ?Laryngoscope Size: Mac and 4 ?Grade View: Grade I ?Tube type: Oral ?Tube size: 7.5 mm ?Number of attempts: 1 ?Airway Equipment and Method: Stylet ?Placement Confirmation: ETT inserted through vocal cords under direct vision, positive ETCO2 and breath sounds checked- equal and bilateral ?Secured at: 7.5 cm ?Tube secured with: Tape ?Dental Injury: Teeth and Oropharynx as per pre-operative assessment  ? ? ? ? ?

## 2022-03-21 NOTE — Anesthesia Postprocedure Evaluation (Signed)
Anesthesia Post Note ? ?Patient: Audrey Rice ? ?Procedure(s) Performed: ROBOTIC SLEEVE GASTRECTOMY (Abdomen) ?UPPER GI ENDOSCOPY (Esophagus) ? ?  ? ?Patient location during evaluation: PACU ?Anesthesia Type: General ?Level of consciousness: awake and alert ?Pain management: pain level controlled ?Vital Signs Assessment: post-procedure vital signs reviewed and stable ?Respiratory status: spontaneous breathing, nonlabored ventilation and respiratory function stable ?Cardiovascular status: blood pressure returned to baseline and stable ?Postop Assessment: no apparent nausea or vomiting ?Anesthetic complications: no ? ? ?No notable events documented. ? ?Last Vitals:  ?Vitals:  ? 03/21/22 1630 03/21/22 1735  ?BP: (!) 158/83 129/76  ?Pulse: 73 63  ?Resp: 19 16  ?Temp:    ?SpO2: 98% 100%  ?  ?Last Pain:  ?Vitals:  ? 03/21/22 1630  ?TempSrc:   ?PainSc: Asleep  ? ? ?  ?  ?  ?  ?  ?  ? ?Merlinda Frederick ? ? ? ? ?

## 2022-03-21 NOTE — Interval H&P Note (Signed)
History and Physical Interval Note: ? ?03/21/2022 ?1:04 PM ? ?[name]  has presented today for surgery, with the diagnosis of MORBID OBESITY.  The various methods of treatment have been discussed with the patient and family. After consideration of risks, benefits and other options for treatment, the patient has consented to  Procedure(s): ?ROBOTIC SLEEVE GASTRECTOMY (N/A) ?UPPER GI ENDOSCOPY (N/A) as a surgical intervention.  The patient's history has been reviewed, patient examined, no change in status, stable for surgery.  I have reviewed the patient's chart and labs.  Questions were answered to the patient's satisfaction.   ? ? ?Audrey Rice ? ? ?

## 2022-03-21 NOTE — Progress Notes (Signed)
PHARMACY CONSULT FOR:  Risk Assessment for Post-Discharge VTE Following Bariatric Surgery ? ?Post-Discharge VTE Risk Assessment: ?This patient's probability of 30-day post-discharge VTE is increased due to the factors marked: ?X Sleeve gastrectomy  ? Liver disorder (transplant, cirrhosis, or nonalcoholic steatohepatitis)  ? Hx of VTE  ? Hemorrhage requiring transfusion  ? GI perforation, leak, or obstruction  ? ====================================================  ?  Female  ?  Age >/=60 years  ?  BMI >/=50 kg/m2  ?  CHF  ?  Dyspnea at Rest  ?  Paraplegia  ?X  Non-gastric-band surgery  ?  Operation Time >/=3 hr  ?  Return to OR   ?  Length of Stay >/= 3 d  ? Hypercoagulable condition  ? Significant venous stasis  ? ? ?Predicted probability of 30-day post-discharge VTE: 0.16% (LOW RISK) ? ?Other patient-specific factors to consider: N/A ? ? ?Recommendation for Discharge: ?No pharmacologic prophylaxis post-discharge ?Follow daily and recalculate estimated 30d VTE risk if returns to OR or LOS reaches 3 days. ? ? ?Audrey Rice is a 46 y.o. female who underwent laparoscopic sleeve gastrectomy on 03/21/22 ?  ?Case start: 1408 ?Case end: 1530 ? ? ?No Known Allergies ? ?Patient Measurements: ?Height: '5\' 7"'$  (170.2 cm) ?Weight: 134.3 kg (296 lb) ?IBW/kg (Calculated) : 61.6 ?Body mass index is 46.36 kg/m?. ? ?Recent Labs  ?  03/21/22 ?1705 03/21/22 ?1914  ?WBC 17.1*  --   ?HGB 11.9* 11.7*  ?HCT 38.0 35.3*  ?PLT 305  --   ?CREATININE 0.78  --   ? ?Estimated Creatinine Clearance: 129.8 mL/min (by C-G formula based on SCr of 0.78 mg/dL). ? ?Past Medical History:  ?Diagnosis Date  ? Anxiety   ? Depression   ? GERD (gastroesophageal reflux disease)   ? ? ?Medications Prior to Admission  ?Medication Sig Dispense Refill Last Dose  ? metFORMIN (GLUCOPHAGE) 500 MG tablet Take 1 tablet (500 mg total) by mouth 2 (two) times daily with a meal. (Patient not taking: Reported on 03/02/2022) 180 tablet 1 Not Taking  ? ? ? ?Reuel Boom, PharmD,  BCPS ?[phone] ?03/21/2022, 8:24 PM ? ? ?

## 2022-03-21 NOTE — Anesthesia Preprocedure Evaluation (Addendum)
Anesthesia Evaluation  ?Patient identified by MRN, date of birth, ID band ?Patient awake ? ? ? ?Reviewed: ?Allergy & Precautions, NPO status , Patient's Chart, lab work & pertinent test results ? ?Airway ?Mallampati: II ? ?TM Distance: >3 FB ?Neck ROM: Full ? ? ? Dental ?no notable dental hx. ? ?  ?Pulmonary ?neg pulmonary ROS,  ?  ?Pulmonary exam normal ?breath sounds clear to auscultation ? ? ? ? ? ? Cardiovascular ?Exercise Tolerance: Good ?negative cardio ROS ?Normal cardiovascular exam ?Rhythm:Regular Rate:Normal ? ? ?  ?Neuro/Psych ?PSYCHIATRIC DISORDERS Anxiety Depression negative neurological ROS ?   ? GI/Hepatic ?Neg liver ROS, GERD  ,  ?Endo/Other  ?Morbid obesity ? Renal/GU ?negative Renal ROS  ?negative genitourinary ?  ?Musculoskeletal ?negative musculoskeletal ROS ?(+)  ? Abdominal ?  ?Peds ?negative pediatric ROS ?(+)  Hematology ?negative hematology ROS ?(+)   ?Anesthesia Other Findings ? ? Reproductive/Obstetrics ?negative OB ROS ? ?  ? ? ? ? ? ? ? ? ? ? ? ? ? ?  ?  ? ? ? ? ? ? ? ?Anesthesia Physical ?Anesthesia Plan ? ?ASA: 3 ? ?Anesthesia Plan: General  ? ?Post-op Pain Management: Tylenol PO (pre-op)*  ? ?Induction: Intravenous ? ?PONV Risk Score and Plan: 3 and Scopolamine patch - Pre-op, Dexamethasone, Midazolam and Ondansetron ? ?Airway Management Planned: Oral ETT ? ?Additional Equipment:  ? ?Intra-op Plan:  ? ?Post-operative Plan: Extubation in OR ? ?Informed Consent: I have reviewed the patients History and Physical, chart, labs and discussed the procedure including the risks, benefits and alternatives for the proposed anesthesia with the patient or authorized representative who has indicated his/her understanding and acceptance.  ? ? ? ?Dental advisory given ? ?Plan Discussed with: CRNA, Anesthesiologist and Surgeon ? ?Anesthesia Plan Comments:   ? ? ? ? ? ? ?Anesthesia Quick Evaluation ? ?

## 2022-03-21 NOTE — Transfer of Care (Signed)
Immediate Anesthesia Transfer of Care Note ? ?Patient: Audrey Rice ? ?Procedure(s) Performed: ROBOTIC SLEEVE GASTRECTOMY (Abdomen) ?UPPER GI ENDOSCOPY (Esophagus) ? ?Patient Location: PACU ? ?Anesthesia Type:General ? ?Level of Consciousness: awake, alert  and oriented ? ?Airway & Oxygen Therapy: Patient Spontanous Breathing and Patient connected to face mask oxygen ? ?Post-op Assessment: Report given to RN and Post -op Vital signs reviewed and stable ? ?Post vital signs: Reviewed and stable ? ?Last Vitals:  ?Vitals Value Taken Time  ?BP 155/82 03/21/22 1534  ?Temp    ?Pulse 80 03/21/22 1536  ?Resp 16 03/21/22 1536  ?SpO2 100 % 03/21/22 1536  ?Vitals shown include unvalidated device data. ? ?Last Pain:  ?Vitals:  ? 03/21/22 1211  ?TempSrc:   ?PainSc: 0-No pain  ?   ? ?  ? ?Complications: No notable events documented. ?

## 2022-03-21 NOTE — Op Note (Signed)
? ?  Surgeon: Kaylyn Lim, MD, FACS ? ?Asst:  Louanna Raw, MD, FACS ?21 March 2022 ?Anes:  General endotracheal ? ?Procedure: Robotic sleeve gastrectomy and upper endoscopy ? ?Diagnosis: Morbid obesity BMI >45 ? ?Complications: none ? ?EBL:   minimal cc ? ?Description of Procedure: ? The patient was take to OR 2 and given general anesthesia.  The abdomen was prepped with Chloroprep and draped sterilely.  A timeout was performed.  Access to the abdomen was achieved with a 5 mm Optiview through the left upper quadrant.  Following insufflation a 12 was placed in the lateral position on the right and 4 eight mm trocars were placed. ? ?Following insufflation, the state of the abdomen was found to be free of adhesions.  The Sherrie Sport was placed and no dimple or evidence of a hiatal hernia was seenThe ViSiGi 36Fr tube was inserted to deflate the stomach and was pulled back into the esophagus.  .   ? ?The pylorus was identified and we measured 6 cm back and marked the antrum.  At that point we began dissection to take down the greater curvature of the stomach using the vessel sealer.  This dissection was taken all the way up to the left crus.  Posterior attachments of the stomach were also taken down.   ? ?The ViSiGi tube was then passed into the antrum and suction applied so that it was snug along the lessor curvature.  The "crow's foot" or incisura was identified.  The sleeve gastrectomy was begun using the Sureform platform stapler beginning with a blue load and followed by sequential white loads.  . ? ?When the sleeve was complete the tube was taken off suction and insufflated briefly.  The tube was withdrawn.  Upper endoscopy was then performed by Dr. Hassell Done and this showed a nice cylindrical sleeve with no evidence of leaks for bleeding..   ? ? The specimen was extracted through the 15 trocar site. This site was closed with a PMI and 0 vicryl.   Local block was provided by infiltrating abdomen as a TAP block( at  the beginning of the case) and then closed 4-0 Monocryl and Dermabond.   ? ?Matt B. Hassell Done, MD, FACS ?[facility], [place] ?[phone]  ?

## 2022-03-22 ENCOUNTER — Other Ambulatory Visit (HOSPITAL_COMMUNITY): Payer: Self-pay

## 2022-03-22 ENCOUNTER — Encounter (HOSPITAL_COMMUNITY): Payer: Self-pay | Admitting: Surgery

## 2022-03-22 LAB — CBC WITH DIFFERENTIAL/PLATELET
Abs Immature Granulocytes: 0.04 10*3/uL (ref 0.00–0.07)
Basophils Absolute: 0 10*3/uL (ref 0.0–0.1)
Basophils Relative: 0 %
Eosinophils Absolute: 0 10*3/uL (ref 0.0–0.5)
Eosinophils Relative: 0 %
HCT: 35.5 % — ABNORMAL LOW (ref 36.0–46.0)
Hemoglobin: 11.6 g/dL — ABNORMAL LOW (ref 12.0–15.0)
Immature Granulocytes: 0 %
Lymphocytes Relative: 6 %
Lymphs Abs: 0.6 10*3/uL — ABNORMAL LOW (ref 0.7–4.0)
MCH: 27.6 pg (ref 26.0–34.0)
MCHC: 32.7 g/dL (ref 30.0–36.0)
MCV: 84.5 fL (ref 80.0–100.0)
Monocytes Absolute: 0.2 10*3/uL (ref 0.1–1.0)
Monocytes Relative: 2 %
Neutro Abs: 8.8 10*3/uL — ABNORMAL HIGH (ref 1.7–7.7)
Neutrophils Relative %: 92 %
Platelets: 290 10*3/uL (ref 150–400)
RBC: 4.2 MIL/uL (ref 3.87–5.11)
RDW: 14.5 % (ref 11.5–15.5)
WBC: 9.6 10*3/uL (ref 4.0–10.5)
nRBC: 0 % (ref 0.0–0.2)

## 2022-03-22 MED ORDER — OXYCODONE HCL 5 MG PO TABS
5.0000 mg | ORAL_TABLET | Freq: Four times a day (QID) | ORAL | 0 refills | Status: DC | PRN
  Filled 2022-03-22: qty 10, 3d supply, fill #0

## 2022-03-22 MED ORDER — ONDANSETRON 4 MG PO TBDP
4.0000 mg | ORAL_TABLET | Freq: Four times a day (QID) | ORAL | 0 refills | Status: DC | PRN
  Filled 2022-03-22: qty 20, 5d supply, fill #0

## 2022-03-22 MED ORDER — PANTOPRAZOLE SODIUM 40 MG PO TBEC
40.0000 mg | DELAYED_RELEASE_TABLET | Freq: Every day | ORAL | 0 refills | Status: DC
  Filled 2022-03-22: qty 90, 90d supply, fill #0

## 2022-03-22 NOTE — Discharge Instructions (Signed)
GASTRIC BYPASS / SLEEVE  ?Home Care Instructions ? ?These instructions are to help you care for yourself when you go home. ? ?Call: If you have any problems. ?Call [phone] and ask for the surgeon on call ?If you have an emergency related to your surgery please use the ER at [facility].  ?Tell the ER staff that you are a new post-op gastric bypass or gastric sleeve patient ?  ?Signs and symptoms to report: Severe vomiting or nausea ?If you cannot handle clear liquids for longer than 1 day, call your surgeon  ?Abdominal pain which does not get better after taking your pain medication ?Fever greater than 100.4? F and chills ?Heart rate over 100 beats a Audrey ?Trouble breathing ?Chest pain ? Redness, swelling, drainage, or foul odor at incision (surgical) sites ? If your incisions open or pull apart ?Swelling or pain in calf (lower leg) ?Diarrhea (Loose bowel movements that happen often), frequent watery, uncontrolled bowel movements ?Constipation, (no bowel movements for 3 days) if this happens:  ?Take Audrey Rice, 2 tablespoons by mouth, 3 times a day for 2 days if needed ?Stop taking Audrey Rice once you have had a bowel movement ?Call your doctor if constipation continues ?Or ?Take Audrey Rice  (instead of Audrey Rice) following the label instructions ?Stop taking Audrey Rice once you have had a bowel movement ?Call your doctor if constipation continues ?Anything you think is ?abnormal for you? ?  ?Normal side effects after surgery: Unable to sleep at night or unable to concentrate ?Irritability ?Being tearful (crying) or depressed ?These are common complaints, possibly related to your anesthesia, stress of surgery and change in lifestyle, that usually go away a few weeks after surgery.  If these feelings continue, call your medical doctor.  ?Wound Care: You may have surgical glue, steri-strips, or staples over your incisions after surgery ?Surgical glue:  Looks like a clear film over your incisions  and will wear off a little at a time ?Steri-strips : Adhesive strips of tape over your incisions. You may notice a yellowish color on the skin under the steri-strips. This is used to make the   steri-strips stick better. Do not pull the steri-strips off - let them fall off ?Staples: Staples may be removed before you leave the hospital ?If you go home with staples, call [facility] at for an appointment with your surgeon?s nurse to have staples removed 10 days after surgery, [phone] ?Showering: You may shower two (2) days after your surgery unless your surgeon tells you differently ?Wash gently around incisions with warm soapy water, rinse well, and gently pat dry  ?If you have a drain (tube from your incision), you may need someone to hold this while you shower  ?No tub baths until staples are removed and incisions are healed   ?  ?Medications: Medications should be liquid or crushed if larger than the size of a dime ?Extended release pills (medication that releases a little bit at a time through the day) should not be crushed ?Depending on the size and number of medications you take, you may need to space (take a few throughout the day)/change the time you take your medications so that you do not over-fill your pouch (smaller stomach) ?Make sure you follow-up with your primary care physician to make medication changes needed during rapid weight loss and life-style changes ?If you have diabetes, follow up with the doctor that orders your diabetes medication(s) within one week after surgery and check   your blood sugar regularly. ?Do not drive while taking narcotics (pain medications) ?DO NOT take NSAID'S (Examples of NSAID's include ibuprofen, naproxen)  ?Diet:                    First 2 Weeks ? You will see the nutritionist about two (2) weeks after your surgery. The nutritionist will increase the types of foods you can eat if you are handling liquids well: ?If you have severe vomiting or nausea  and cannot handle clear liquids lasting longer than 1 day, call your surgeon  ?Protein Shake ?Drink at least 2 ounces of shake 5-6 times per day ?Each serving of protein shakes (usually 8 - 12 ounces) should have a minimum of:  ?15 grams of protein  ?And no more than 5 grams of carbohydrate  ?Goal for protein each day: ?Men = 80 grams per day ?Women = 60 grams per day ?Protein powder may be added to fluids such as non-fat Audrey or Audrey Rice Audrey or [name] (limit to 35 grams added protein powder per serving) ? ?Hydration ?Slowly increase the amount of water and other clear liquids as tolerated (See Acceptable Fluids) ?Slowly increase the amount of protein shake as tolerated  ? Sip fluids slowly and throughout the day ?May use sugar substitutes in small amounts (no more than 6 - 8 packets per day; i.e. Audrey Rice) ? ?Fluid Goal ?The first goal is to drink at least 8 ounces of protein shake/drink per day (or as directed by the nutritionist);  See handout from pre-op Bariatric Education Class for examples of protein shake/drink.   ?Slowly increase the amount of protein shake you drink as tolerated ?You may find it easier to slowly sip shakes throughout the day ?It is important to get your proteins in first ?Your fluid goal is to drink 64 - 100 ounces of fluid daily ?It may take a few weeks to build up to this ?32 oz (or more) should be clear liquids  ?And  ?32 oz (or more) should be full liquids (see below for examples) ?Liquids should not contain sugar, caffeine, or carbonation ? ?Clear Liquids: ?Water or Sugar-free flavored water (i.e. Fruit H2O, Audrey Rice) ?Decaffeinated coffee or tea (sugar-free) ?Audrey Rice Rice, Wyler?s Rice, Audrey Rice ?Sugar-free Jell-O ?Bouillon or broth ?Sugar-free Popsicle:   *Less than 20 calories each; Limit 1 per day ? ?Full Liquids: ?Protein Shakes/Drinks + 2 choices per day of other full liquids ?Full liquids must be: ?No More Than 12 grams of Carbs per serving  ?No More Than 3 grams of Fat  per serving ?Strained low-fat cream soup ?Non-Fat Audrey ?Fat-free Audrey Rice Audrey ?Sugar-free yogurt (Audrey Rice, Greek yogurt) ? ? ? ?  ?Vitamins and Minerals Start 1 day after surgery unless otherwise directed by your surgeon ?Bariatric Specific Complete Multivitamins ?Chewable Calcium Citrate with Vitamin D-3 ?(Example: 3 Chewable Calcium Plus 600 with Vitamin D-3) ?Take 500 mg three (3) times a day for a total of 1500 mg each day ?Do not take all 3 doses of calcium at one time as it may cause constipation, and you can only absorb 500 mg  at a time  ?Do not mix multivitamins containing iron with calcium supplements; take 2 hours apart ? ?Menstruating women and those at risk for anemia (a blood disease that causes weakness) may need extra iron ?Talk with your doctor to see if you need more iron ?If you need extra iron: Total daily Iron recommendation (including Vitamins) is 50 to 100   mg Iron/day ?Do not stop taking or change any vitamins or minerals until you talk to your nutritionist or surgeon ?Your nutritionist and/or surgeon must approve all vitamin and mineral supplements ?  ?Activity and Exercise: It is important to continue walking at home.  Limit your physical activity as instructed by your doctor.  During this time, use these guidelines: ?Do not lift anything greater than ten (10) pounds for at least two (2) weeks ?Do not go back to work or drive until your surgeon says you can ?You may have sex when you feel comfortable  ?It is VERY important for female patients to use a reliable birth control method; fertility often increases after surgery  ?Do not get pregnant for at least 18 months ?Start exercising as soon as your doctor tells you that you can ?Make sure your doctor approves any physical activity ?Start with a simple walking program ?Walk 5-15 minutes each day, 7 days per week.  ?Slowly increase until you are walking 30-45 minutes per day ?Consider joining our BELT program. [phone] or email  [email or web address] ?  ?Special Instructions Things to remember: ? ?Use your CPAP when sleeping if this applies to you, do not stop the use of CPAP unless directed by physician after a sleep study ?[place]

## 2022-03-22 NOTE — Progress Notes (Signed)
Patient alert and oriented, pain is controlled. Patient is tolerating fluids, advanced to protein shake today, patient is tolerating well.  Reviewed Gastric sleeve discharge instructions with patient and patient is able to articulate understanding.  Provided information on BELT program, Support Group and [facility] outpatient pharmacy. All questions answered, will continue to monitor.  

## 2022-03-22 NOTE — Progress Notes (Signed)

## 2022-03-22 NOTE — Progress Notes (Signed)
Patient completed her water. Tolerated  well, no c/o nausea. Stared protein drink now. Will continue to monitor.  ?

## 2022-03-22 NOTE — Discharge Summary (Signed)
Physician Discharge Summary  ?Patient ID: ?Audrey Rice ?MRN: [id number] ?DOB/AGE: 1976/05/15 46 y.o. ? ?PCP: de [place], Raymond J, MD ? ?Admit date: 03/21/2022 ?Discharge date: 03/22/2022 ? ?Admission Diagnoses:  obesity ? ?Discharge Diagnoses:  same  ?Principal Problem: ?  S/P laparoscopic sleeve gastrectomy ? ? ?Surgery:  robotic Xi sleeve gastrectomy ? ?Discharged Condition: improved ? ?Hospital Course:   Had surgery on Monday, April 17.  Begun on liquids which were tolerated and ready for discharge on Tuesday. ? ?Consults: none ? ?Significant Diagnostic Studies: none ? ? ? ?Discharge Exam: ?Blood pressure 130/81, pulse 61, temperature 97.8 ?F (36.6 ?C), temperature source Oral, resp. rate 16, height '5\' 7"'$  (1.702 m), weight 134.3 kg, last menstrual period 02/16/2022, SpO2 95 %. ?Incisions ok.  ? ?Disposition: Discharge disposition: 01-Home or Self Care ? ? ? ? ? ? ?Discharge Instructions   ? ? Ambulate hourly while awake   Complete by: As directed ?  ? Call MD for:  difficulty breathing, headache or visual disturbances   Complete by: As directed ?  ? Call MD for:  persistant dizziness or light-headedness   Complete by: As directed ?  ? Call MD for:  persistant nausea and vomiting   Complete by: As directed ?  ? Call MD for:  redness, tenderness, or signs of infection (pain, swelling, redness, odor or green/yellow discharge around incision site)   Complete by: As directed ?  ? Call MD for:  severe uncontrolled pain   Complete by: As directed ?  ? Call MD for:  temperature >101 F   Complete by: As directed ?  ? Diet bariatric full liquid   Complete by: As directed ?  ? Incentive spirometry   Complete by: As directed ?  ? Perform hourly while awake  ? ?  ? ?Allergies as of 03/22/2022   ?No Known Allergies ?  ? ?  ?Medication List  ?  ? ?TAKE these medications   ? ?metFORMIN 500 MG tablet ?Commonly known as: GLUCOPHAGE ?Take 1 tablet (500 mg total) by mouth 2 (two) times daily with a meal. ?Notes to patient: Monitor  Blood Sugar Frequently and keep a log for primary care physician, you may need to adjust medication dosage with rapid weight loss.   ?  ?ondansetron 4 MG disintegrating tablet ?Commonly known as: ZOFRAN-ODT ?Take 1 tablet (4 mg total) by mouth every 6 (six) hours as needed for nausea or vomiting. ?  ?oxyCODONE 5 MG immediate release tablet ?Commonly known as: Oxy IR/ROXICODONE ?Take 1 tablet (5 mg total) by mouth every 6 (six) hours as needed for severe pain. ?  ?pantoprazole 40 MG tablet ?Commonly known as: PROTONIX ?Take 1 tablet (40 mg total) by mouth daily. ?  ? ?  ? ? Follow-up Information   ? ? Johnathan Hausen, MD. Daphane Shepherd on 04/13/2022.   ?Specialty: General Surgery ?Why: at 10:30am in the [facility]. Please arrive 15 minutes prior to your appointment time.  Thank you. ?Contact information: ?[address] ?[address] ?[place] [address] ?[phone] ? ? ?  ?  ? ? Johnathan Hausen, MD. Daphane Shepherd on 05/11/2022.   ?Specialty: General Surgery ?Why: at [place] in the [place]. Please arrive 15 minutes prior to your appointment time.  Thank you. ?Contact information: ?[address] ?[address] ?[place] [address] ?[phone] ? ? ?  ?  ? ?  ?  ? ?  ? ? ?Signed: ?Pedro Earls ?03/22/2022, 12:39 PM ?  ? ?

## 2022-03-22 NOTE — Progress Notes (Signed)
Transition of Care (TOC) Screening Note ? ?Patient Details  ?Name: Audrey Rice ?Date of Birth: 08-18-1976 ? ?Transition of Care (TOC) CM/SW Contact:    ?Sherie Don, LCSW ?Phone Number: ?03/22/2022, 10:23 AM ? ?Transition of Care Department [facility]) has reviewed patient and no TOC needs have been identified at this time. We will continue to monitor patient advancement through interdisciplinary progression rounds. If new patient transition needs arise, please place a TOC consult. ?

## 2022-03-22 NOTE — Progress Notes (Signed)
Pt was discharged home today. Instructions were reviewed with patient, and questions were answered. Pt walked to the main entrance upon discharge. Patient meds were brought to the room from pharmacy.  ?

## 2022-03-22 NOTE — Plan of Care (Signed)
?  Problem: Education: ?Goal: Ability to state signs and symptoms to report to health care provider will improve ?Outcome: Completed/Met ?Goal: Knowledge of the prescribed self-care regimen will improve ?Outcome: Completed/Met ?Goal: Knowledge of discharge needs will improve ?Outcome: Completed/Met ?  ?Problem: Activity: ?Goal: Ability to tolerate increased activity will improve ?Outcome: Completed/Met ?  ?Problem: Bowel/Gastric: ?Goal: Gastrointestinal status for postoperative course will improve ?Outcome: Completed/Met ?Goal: Occurrences of nausea will decrease ?Outcome: Completed/Met ?  ?Problem: Coping: ?Goal: Development of coping mechanisms to deal with changes in body function or appearance will improve ?Outcome: Completed/Met ?  ?Problem: Fluid Volume: ?Goal: Maintenance of adequate hydration will improve ?Outcome: Completed/Met ?  ?Problem: Nutritional: ?Goal: Nutritional status will improve ?Outcome: Completed/Met ?  ?Problem: Clinical Measurements: ?Goal: Will show no signs or symptoms of venous thromboembolism ?Outcome: Completed/Met ?Goal: Will remain free from infection ?Outcome: Completed/Met ?Goal: Will show no signs of GI Leak ?Outcome: Completed/Met ?  ?Problem: Respiratory: ?Goal: Will regain and/or maintain adequate ventilation ?Outcome: Completed/Met ?  ?Problem: Pain Management: ?Goal: Pain level will decrease ?Outcome: Completed/Met ?  ?Problem: Skin Integrity: ?Goal: Demonstration of wound healing without infection will improve ?Outcome: Completed/Met ?  ?

## 2022-03-22 NOTE — Progress Notes (Signed)
24hr fluid recall prior to discharge: 376m. Per dehydration, will call pt to f/u within one week post op. ?

## 2022-03-22 NOTE — Consult Note (Signed)
? ?  [facility] CM Inpatient Consult ? ? ?03/22/2022 ? ?[name] ?1976/10/16 ?[id number] ? ? [place] Patient:  Audrey Rice plan ? ?Patient at [facility] ? ?Primary Care Provider:  [place], Blondell Reveal, MD, [facility] ? ? ?Patient will be  assigned to a [facility] Management for telephonic chronic disease management services outreach for post hospital and community resource support as part of the health plan follow up.  ? ?Plan: Request [facility] RNCM for TOC follow up call.  ? ?  ?For additional questions or referrals please contact: ?  ?Natividad Brood, RN BSN CCM ?[facility] Liaison ? [phone] business mobile phone ?Toll free office [phone]  ?Fax number: [phone] ?[email or web address] ? ? ?

## 2022-03-23 ENCOUNTER — Other Ambulatory Visit: Payer: Self-pay

## 2022-03-23 ENCOUNTER — Other Ambulatory Visit: Payer: Self-pay | Admitting: *Deleted

## 2022-03-23 DIAGNOSIS — Z9884 Bariatric surgery status: Secondary | ICD-10-CM

## 2022-03-23 LAB — SURGICAL PATHOLOGY

## 2022-03-23 NOTE — Patient Outreach (Signed)
Received a hospital discharge notification for Mr. Totten . ?I have assigned Valente David, RN to call for transition of care and determine if there are any Case Management needs.  ?  ?Arville Care, CBCS, CMAA ?[place] Management Assistant ?[place] Management ?[phone]   ?

## 2022-03-23 NOTE — Patient Outreach (Signed)
[facility]) Care Management ? ?03/23/2022 ? ?Audrey Rice ?[birth year] ?[id number] ? ? ?Transition of care call  ? ?Referral received: 4/19 ?Initial outreach: 4/19 ?Insurance: [place] [employer]/Save/Choice Plan ?  ?Subjective: ?Initial successful telephone call to patient's preferred number in order to complete transition of care assessment; 2 HIPAA identifiers verified. Explained purpose of call and completed transition of care assessment.  States she is doing well, denies post op problems, states surgical pain well managed with Tylenol, tolerating  diet, denies bowel or bladder problems, denies nausea/vomiting.  Spouse and children are assisting with her recovery.  ? ? ?  ?Objective:  ?Audrey Rice was hospitalized at [facility] from 4/17-4/18 for elective laparoscopic sleeve gastrectomy.  Comorbidities include: Obesity.  She was discharged to home on 4/18 without the need for home health services or DME. ?  ?Assessment:  ?Patient voices good understanding of all discharge instructions.  See transition of care flowsheet for assessment details.  She has follow up appointment at the bariatric clinic on 5/2 and with the surgeon on 5/10.  She will also see her PCP on 5/30 as well as the nutritionist on 6/13.  Either she or her husband will provide transportation.  ?  ?Plan:  ?Member already active with [facility].  No ongoing care management needs identified so will close case to [place] Management care management services and route successful outreach letter with [employer] Management pamphlet and 24 Hour Nurse Line Magnet to [place] Management clinical pool to be mailed to patient's home address.  ? ?Valente David, RN, MSN, CCM ?[facility] Care Management  ?Community Care Manager ?[phone] ? ?

## 2022-03-25 ENCOUNTER — Telehealth (HOSPITAL_COMMUNITY): Payer: Self-pay | Admitting: *Deleted

## 2022-03-25 NOTE — Telephone Encounter (Signed)
1.  Tell me about your pain and pain management? ?Pt denies any pain. ? ?2.  Let's talk about fluid intake.  How much total fluid are you taking in? ?Pt states that she is getting in at least 64oz of fluid including protein shakes, bottled water.  Pt instructed to assess status and suggestions daily utilizing Hydration Action Plan on discharge folder and to call [facility] if in the "red zone".  ? ?3.  How much protein have you taken in the last 2 days? ?Pt states she is meeting her goal of 60g of protein each day with the protein shakes. ? ?4.  Have you had nausea?  Tell me about when have experienced nausea and what you did to help? ?Pt denies nausea. ?  ?5.  Has the frequency or color changed with your urine? ?Pt states that she is urinating "fine" with no changes in frequency or urgency.   ?  ?6.  Tell me what your incisions look like? ?"Incisions look fine". Pt denies a fever, chills.  Pt states incisions are not swollen, open, or draining.  Pt encouraged to call [facility] if incisions change. ?  ?7.  Have you been passing gas? BM? ?Pt states that she is having BMs. Last BM 03/25/22. Pt states that she has had some liquid BMs, but she thinks its from the artificial sugar in Gatorade zero and water packets.  Pt has stopped drinking those items and only had water, which she feels has stopped the liquid stools.  Pt states last BM was formed. Pt encouraged to call office is diarrhea persists with other red zone symptoms. ? ?  ?8.  If a problem or question were to arise who would you call?  Do you know contact numbers for [place] [facility], [facility], and [facility]? ?Pt denies dehydration symptoms.  Pt can describe s/sx of dehydration.  Pt knows to call [facility] for surgical, [facility] for nutrition, and [facility] for non-urgent questions or concerns. ?  ?9.  How has the walking going? ?Pt states she is walking around and able to be active without difficulty. ?  ?10. Are you still using your incentive spirometer?  If so, how often? ?Pt states that she is not doing the  I.S as often because she is being intentional about walking and being active.   Pt encouraged to use incentive spirometer, at least 10x every hour while awake until she sees the surgeon. ? ?11.  How are your vitamins and calcium going?  How are you taking them? ?Pt states that she is taking her supplements and vitamins without difficulty with the exception of getting the third calcium in daily.  Pt states that she is working to get the third one in today.  ? ? ?Reminded patient that the first 30 days post-operatively are important for successful recovery.  Practice good hand hygiene, wearing a mask when appropriate (since optional in most places), and minimizing exposure to people who live outside of the home, especially if they are exhibiting any respiratory, GI, or illness-like symptoms.   ? ?

## 2022-04-05 ENCOUNTER — Encounter: Payer: 59 | Attending: Surgery | Admitting: Skilled Nursing Facility1

## 2022-04-07 ENCOUNTER — Encounter: Payer: Self-pay | Admitting: Skilled Nursing Facility1

## 2022-04-07 NOTE — Progress Notes (Signed)
2 Week Post-Operative Nutrition Class ?  ?Patient was seen on 04/05/2022 for Post-Operative Nutrition education at the [facility].  ?  ?Surgery date: 03/21/2022 ?Surgery type: Sleeve ?Start weight at [facility]: 301.5  ?Weight today: 290.1 ?  ?Body Composition Scale 04/05/2022  ?Current Body Weight 290.1  ?Total Body Fat % 46.8  ?Visceral Fat 17  ?Fat-Free Mass % 53.1  ? Total Body Water % 41.0  ?Muscle-Mass lbs 34.7  ?BMI 45.1  ?Body Fat Displacement   ?       Torso  lbs 84.2  ?       Left Leg  lbs 16.8  ?       Right Leg  lbs 16.8  ?       Left Arm  lbs 8.4  ?       Right Arm   lbs 8.4  ? ? ?  ?The following the learning objectives were met by the patient during this course: ?Identifies Phase 3 (Soft, High Proteins) Dietary Goals and will begin from 2 weeks post-operatively to 2 months post-operatively ?Identifies appropriate sources of fluids and proteins  ?Identifies appropriate fat sources and healthy verses unhealthy fat types   ?States protein recommendations and appropriate sources post-operatively ?Identifies the need for appropriate texture modifications, mastication, and bite sizes when consuming solids ?Identifies appropriate fat consumption and sources ?Identifies appropriate multivitamin and calcium sources post-operatively ?Describes the need for physical activity post-operatively and will follow MD recommendations ?States when to call healthcare provider regarding medication questions or post-operative complications ?  ?Handouts given during class include: ?Phase 3A: Soft, High Protein Diet Handout ?Phase 3 High Protein Meals ?Healthy Fats ?  ?Follow-Up Plan: ?Patient will follow-up at [facility] in 6 weeks for 2 month post-op nutrition visit for diet advancement per MD.  ?

## 2022-04-11 ENCOUNTER — Telehealth: Payer: Self-pay | Admitting: Skilled Nursing Facility1

## 2022-04-11 NOTE — Telephone Encounter (Signed)
RD called pt to verify fluid intake once starting soft, solid proteins 2 week post-bariatric surgery.  ? ?Daily Fluid intake: 64+ ?Daily Protein intake: 60+ ?Bowel Habits: daily  ? ?Concerns/issues:   ?

## 2022-04-12 NOTE — H&P (Signed)
This H&P was updated on the morning of her surgery.  That is when it was signed.  Audrey Rice ?

## 2022-05-03 ENCOUNTER — Encounter (HOSPITAL_BASED_OUTPATIENT_CLINIC_OR_DEPARTMENT_OTHER): Payer: Self-pay | Admitting: Family Medicine

## 2022-05-03 ENCOUNTER — Ambulatory Visit (INDEPENDENT_AMBULATORY_CARE_PROVIDER_SITE_OTHER): Payer: 59 | Admitting: Family Medicine

## 2022-05-03 ENCOUNTER — Other Ambulatory Visit (HOSPITAL_BASED_OUTPATIENT_CLINIC_OR_DEPARTMENT_OTHER): Payer: Self-pay

## 2022-05-03 DIAGNOSIS — Z0000 Encounter for general adult medical examination without abnormal findings: Secondary | ICD-10-CM

## 2022-05-03 DIAGNOSIS — F32A Depression, unspecified: Secondary | ICD-10-CM

## 2022-05-03 MED ORDER — BUPROPION HCL ER (XL) 150 MG PO TB24
150.0000 mg | ORAL_TABLET | Freq: Every day | ORAL | 1 refills | Status: AC
  Filled 2022-05-03: qty 30, 30d supply, fill #0
  Filled 2022-06-27: qty 30, 30d supply, fill #1

## 2022-05-03 NOTE — Assessment & Plan Note (Signed)
History of depression, was on Wellbutrin in the past, however did stop this.  She reports that she has been having some symptoms, she completed PHQ-9 today with total score of 10.  Would be interested in resuming Wellbutrin.  Previously had been on 450 mg daily dose, was utilizing once daily dosing Discussed options with patient today, given her current symptoms, she would like to resume therapy with Wellbutrin.  Will send prescription for extended release for once daily dosing, initiating at 150 mg dose.  Plan for follow-up in about 3 to 4 weeks to monitor progress and determine if titration to 300 mg dose is needed

## 2022-05-03 NOTE — Assessment & Plan Note (Addendum)
Routine HCM labs ordered. HCM reviewed/discussed. Anticipatory guidance regarding healthy weight, lifestyle and choices given. Recommend healthy diet.  Recommend approximately 150 minutes/week of moderate intensity exercise Recommend regular dental and vision exams Always use seatbelt/lap and shoulder restraints Recommend using smoke alarms and checking batteries at least twice a year Recommend using sunscreen when outside Discussed tetanus immunization recommendations, patient reports having this completed in 2021, she will look up this info and let us know With recent surgery, she did have CBC and CMP checked which were unremarkable.  We will check TSH, lipid panel, A1c today

## 2022-05-03 NOTE — Progress Notes (Signed)
Subjective:    CC: Annual Physical Exam  HPI:  Audrey Rice is a 46 y.o. presenting for annual physical  I reviewed the past medical history, family history, social history, surgical history, and allergies today and no changes were needed.  Please see the problem list section below in epic for further details.  Past Medical History: Past Medical History:  Diagnosis Date   Anxiety    Depression    GERD (gastroesophageal reflux disease)    Past Surgical History: Past Surgical History:  Procedure Laterality Date   UPPER GI ENDOSCOPY N/A 03/21/2022   Procedure: UPPER GI ENDOSCOPY;  Surgeon: Johnathan Hausen, MD;  Location: [facility];  Service: General;  Laterality: N/A;   Social History: Social History   Socioeconomic History   Marital status: Married    Spouse name: Not on file   Number of children: Not on file   Years of education: Not on file   Highest education level: Not on file  Occupational History   Not on file  Tobacco Use   Smoking status: Never   Smokeless tobacco: Never  Vaping Use   Vaping Use: Never used  Substance and Sexual Activity   Alcohol use: Never   Drug use: Never   Sexual activity: Yes    Birth control/protection: None  Other Topics Concern   Not on file  Social History Narrative   Not on file   Social Determinants of Health   Financial Resource Strain: Not on file  Food Insecurity: Not on file  Transportation Needs: Not on file  Physical Activity: Not on file  Stress: Not on file  Social Connections: Not on file   Family History: Family History  Problem Relation Age of Onset   Ulcerative colitis Mother    Hypothyroidism Mother    Hypothyroidism Father    Cancer Maternal Aunt        breast cancer   Diabetes Maternal Grandmother    Allergies: No Known Allergies Medications: See med rec.  Review of Systems: No headache, visual changes, nausea, vomiting, diarrhea, constipation, dizziness, abdominal pain, skin rash, fevers, chills,  night sweats, swollen lymph nodes, weight loss, chest pain, body aches, joint swelling, muscle aches, shortness of breath, mood changes, visual or auditory hallucinations.  Objective:    BP 121/84   Pulse 74   Temp 97.6 F (36.4 C)   Ht '5\' 7"'$  (1.702 m)   Wt 286 lb 14.4 oz (130.1 kg)   SpO2 98%   BMI 44.93 kg/m   General: Well Developed, well nourished, and in no acute distress.  Neuro: Alert and oriented x3, extra-ocular muscles intact, sensation grossly intact. Cranial nerves II through XII are intact, motor, sensory, and coordinative functions are all intact. HEENT: Normocephalic, atraumatic, pupils equal round reactive to light, neck supple, no masses, no lymphadenopathy, thyroid nonpalpable. Oropharynx, nasopharynx, external ear canals are unremarkable. Skin: Warm and dry, no rashes noted.  Cardiac: Regular rate and rhythm, no murmurs rubs or gallops.  Respiratory: Clear to auscultation bilaterally. Not using accessory muscles, speaking in full sentences.  Abdominal: Soft, nontender, nondistended, positive bowel sounds, no masses, no organomegaly.  Musculoskeletal: Shoulder, elbow, wrist, hip, knee, ankle stable, and with full range of motion.  Impression and Recommendations:    Wellness examination Routine HCM labs ordered. HCM reviewed/discussed. Anticipatory guidance regarding healthy weight, lifestyle and choices given. Recommend healthy diet.  Recommend approximately 150 minutes/week of moderate intensity exercise Recommend regular dental and vision exams Always use seatbelt/lap and shoulder restraints  Recommend using smoke alarms and checking batteries at least twice a year Recommend using sunscreen when outside Discussed tetanus immunization recommendations, patient reports having this completed in 2021, she will look up this info and let us know With recent surgery, she did have CBC and CMP checked which were unremarkable.  We will check TSH, lipid panel, A1c  today  Depression History of depression, was on Wellbutrin in the past, however did stop this.  She reports that she has been having some symptoms, she completed PHQ-9 today with total score of 10.  Would be interested in resuming Wellbutrin.  Previously had been on 450 mg daily dose, was utilizing once daily dosing Discussed options with patient today, given her current symptoms, she would like to resume therapy with Wellbutrin.  Will send prescription for extended release for once daily dosing, initiating at 150 mg dose.  Plan for follow-up in about 3 to 4 weeks to monitor progress and determine if titration to 300 mg dose is needed   ___________________________________________ Kaius Daino de [place], MD, ABFM, CAQSM [facility] and [place]

## 2022-05-04 LAB — LIPID PANEL
Chol/HDL Ratio: 3.5 ratio (ref 0.0–4.4)
Cholesterol, Total: 145 mg/dL (ref 100–199)
HDL: 42 mg/dL (ref >39)
LDL Chol Calc (NIH): 87 mg/dL (ref 0–99)
Triglycerides: 86 mg/dL (ref 0–149)
VLDL Cholesterol Cal: 16 mg/dL (ref 5–40)

## 2022-05-04 LAB — TSH RFX ON ABNORMAL TO FREE T4: TSH: 2.05 u[IU]/mL (ref 0.450–4.500)

## 2022-05-04 LAB — HEMOGLOBIN A1C
Est. average glucose Bld gHb Est-mCnc: 117 mg/dL
Hgb A1c MFr Bld: 5.7 % — ABNORMAL HIGH (ref 4.8–5.6)

## 2022-05-17 ENCOUNTER — Encounter: Payer: Self-pay | Admitting: Skilled Nursing Facility1

## 2022-05-17 ENCOUNTER — Encounter: Payer: 59 | Attending: Family Medicine | Admitting: Skilled Nursing Facility1

## 2022-05-17 DIAGNOSIS — E669 Obesity, unspecified: Secondary | ICD-10-CM | POA: Insufficient documentation

## 2022-05-17 NOTE — Progress Notes (Signed)
Bariatric Nutrition Follow-Up Visit Medical Nutrition Therapy    NUTRITION ASSESSMENT    Surgery date: 03/21/2022 Surgery type: Sleeve Start weight at [facility]: 301.5  Weight today: 278.2 pounds   Body Composition Scale 04/05/2022 05/17/2022  Current Body Weight 290.1 278.2  Total Body Fat % 46.8 45.8  Visceral Fat 17 16  Fat-Free Mass % 53.1 54.1   Total Body Water % 41.0 41.5  Muscle-Mass lbs 34.7 34.6  BMI 45.1 43.3  Body Fat Displacement           Torso  lbs 84.2 79.2         Left Leg  lbs 16.8 15.8         Right Leg  lbs 16.8 15.8         Left Arm  lbs 8.4 7.9         Right Arm   lbs 8.4 7.9   Clinical  Medical hx: prediabetes  Medications: N/A  Labs: A1C 5.8, Vitamin D 26.6 Notable signs/symptoms: constipation when working her work week Any previous deficiencies? Yes: vitamin D   Lifestyle & Dietary Hx  Pt is a hospitalist.   Pt states she tried pasta and it did not go well.  Pt states she logs her foods which helps.  Pt states she does feel tired. Pt states she found a walking friend which helps keep her going.   Pt states she zones out with television to reduce her stress level but would like to be the person that uses activity to reduce her stress: Dietitian encouraged her to tried different activities and maybe find one she finds enjoyable.   Pt states she like plant based options.   Pt states she carries Small water bottles around the hospital to ensure she drinks enough.   Estimated daily fluid intake: 60 oz Estimated daily protein intake: 60+ g Supplements: multi (does not like capsules) and calcium and fiber gummies Current average weekly physical activity: walking 5 days a week 30 minutes  2-3 times a week weights   24-Hr Dietary Recall First Meal: protein shake (unjury planted in water) + iced coffee Snack: 2 string cheese  Second Meal: shredded chicken Snack:   Third Meal: 1 egg and 2 sausage Snack:  Beverages: water, water + flavoring  Post-Op  Goals/ Signs/ Symptoms Using straws: no Drinking while eating: no Chewing/swallowing difficulties: no Changes in vision: no Changes to mood/headaches: no Hair loss/changes to skin/nails: no Difficulty focusing/concentrating: no Sweating: no Limb weakness: no Dizziness/lightheadedness: no Palpitations: no  Carbonated/caffeinated beverages: no N/V/D/C/Gas: no Abdominal pain: no Dumping syndrome: no    NUTRITION DIAGNOSIS  Overweight/obesity ([place]-3.3) related to past poor dietary habits and physical inactivity as evidenced by completed bariatric surgery and following dietary guidelines for continued weight loss and healthy nutrition status.     NUTRITION INTERVENTION Nutrition counseling (C-1) and education (E-2) to facilitate bariatric surgery goals, including: Diet advancement to the next phase (phase 4) now including non starchy vegetables The importance of consuming adequate calories as well as certain nutrients daily due to the body's need for essential vitamins, minerals, and fats The importance of daily physical activity and to reach a goal of at least 150 minutes of moderate to vigorous physical activity weekly (or as directed by their physician) due to benefits such as increased musculature and improved lab values The importance of intuitive eating specifically learning hunger-satiety cues and understanding the importance of learning a new body: The importance of mindful eating to avoid grazing behaviors  Goals: -Continue to aim for a minimum of 64 fluid ounces 7 days a week with at least 30 ounces being plain water  -Eat non-starchy vegetables 2 times a day 7 days a week  -Start out with soft cooked vegetables today and tomorrow; if tolerated begin to eat raw vegetables or cooked including salads  -Eat your 3 ounces of protein first then start in on your non-starchy vegetables; once you understand how much of your meal leads to satisfaction and not full while still eating 3  ounces of protein and non-starchy vegetables you can eat them in any order   -Continue to aim for 30 minutes of activity at least 5 times a week  -Do NOT cook with/add to your food: alfredo sauce, cheese sauce, barbeque sauce, ketchup, fat back, butter, bacon grease, grease, Crisco, OR SUGAR   Handouts Provided Include  Phase 4   Learning Style & Readiness for Change Teaching method utilized: Visual & Auditory  Demonstrated degree of understanding via: Teach Back  Readiness Level: action Barriers to learning/adherence to lifestyle change: none identified   RD's Notes for Next Visit Assess adherence to pt chosen goals     MONITORING & EVALUATION Dietary intake, weekly physical activity, body weight   Next Steps Patient is to follow-up in early september

## 2022-05-25 ENCOUNTER — Ambulatory Visit (INDEPENDENT_AMBULATORY_CARE_PROVIDER_SITE_OTHER): Payer: 59 | Admitting: Family Medicine

## 2022-05-25 DIAGNOSIS — F32A Depression, unspecified: Secondary | ICD-10-CM | POA: Diagnosis not present

## 2022-05-25 NOTE — Progress Notes (Signed)
   Virtual Visit via Telephone   I connected with  Audrey Rice  on 05/25/22 by telephone/telehealth and verified that I am speaking with the correct person using two identifiers.   I discussed the limitations, risks, security and privacy concerns of performing an evaluation and management service by telephone, including the higher likelihood of inaccurate diagnosis and treatment, and the availability of in person appointments.  We also discussed the likely need of an additional face to face encounter for complete and high quality delivery of care.  I also discussed with the patient that there may be a patient responsible charge related to this service. The patient expressed understanding and wishes to proceed.  Provider location is in medical facility. Patient location is at their home, different from provider location. People involved in care of the patient during this telehealth encounter were myself, my nurse/medical assistant, and my front office/scheduling team member.  Review of Systems: No fevers, chills, night sweats, weight loss, chest pain, or shortness of breath.   Objective Findings:    General: Speaking full sentences, no audible heavy breathing.  Sounds alert and appropriately interactive.    Independent interpretation of tests performed by another provider:   None.  Brief History, Exam, Impression, and Recommendations:    Depression Patient has been doing well with reinitiation of Wellbutrin, continues with 150 mg daily dose.  Generally feels that symptoms are improved since beginning the medication, however does feel that she may need to go up on the dose.  In the past she had been on a dose of 450 mg.  At this time, she would prefer to continue with the 150 mg dose for least another couple weeks.  Did discuss that if she decides she would like to titrate dose to 300 mg, this would be reasonable.  She can contact our office if she does not end up increasing dose of  medication and needs new prescription sent to the pharmacy. We will plan for follow-up in about 4 to 6 weeks to monitor progress with medication, this visit can be virtual  I discussed the above assessment and treatment plan with the patient. The patient was provided an opportunity to ask questions and all were answered. The patient agreed with the plan and demonstrated an understanding of the instructions.  The patient was advised to call back or seek an in-person evaluation if the symptoms worsen or if the condition fails to improve as anticipated.  I provided 10 minutes of face to face and non-face-to-face time during this encounter date, time was needed to gather information, review chart, records, communicate/coordinate with staff remotely, as well as complete documentation.   ___________________________________________ Brooklen Runquist de [place], MD, ABFM, CAQ[facility] and [place]

## 2022-05-25 NOTE — Assessment & Plan Note (Signed)
Patient has been doing well with reinitiation of Wellbutrin, continues with 150 mg daily dose.  Generally feels that symptoms are improved since beginning the medication, however does feel that she may need to go up on the dose.  In the past she had been on a dose of 450 mg.  At this time, she would prefer to continue with the 150 mg dose for least another couple weeks.  Did discuss that if she decides she would like to titrate dose to 300 mg, this would be reasonable.  She can contact our office if she does not end up increasing dose of medication and needs new prescription sent to the pharmacy. We will plan for follow-up in about 4 to 6 weeks to monitor progress with medication, this visit can be virtual

## 2022-06-20 DIAGNOSIS — Z760 Encounter for issue of repeat prescription: Secondary | ICD-10-CM | POA: Diagnosis not present

## 2022-06-27 ENCOUNTER — Other Ambulatory Visit (HOSPITAL_COMMUNITY): Payer: Self-pay

## 2022-06-28 ENCOUNTER — Other Ambulatory Visit (HOSPITAL_COMMUNITY): Payer: Self-pay

## 2022-06-28 MED ORDER — PANTOPRAZOLE SODIUM 40 MG PO TBEC
40.0000 mg | DELAYED_RELEASE_TABLET | Freq: Every day | ORAL | 0 refills | Status: AC
  Filled 2022-06-28: qty 90, 90d supply, fill #0

## 2022-08-17 ENCOUNTER — Encounter: Payer: Self-pay | Admitting: Skilled Nursing Facility1

## 2022-08-17 ENCOUNTER — Encounter: Payer: 59 | Attending: Family Medicine | Admitting: Skilled Nursing Facility1

## 2022-08-17 DIAGNOSIS — E669 Obesity, unspecified: Secondary | ICD-10-CM | POA: Diagnosis not present

## 2022-08-17 NOTE — Progress Notes (Signed)
Bariatric Nutrition Follow-Up Visit Medical Nutrition Therapy    NUTRITION ASSESSMENT    Surgery date: 03/21/2022 Surgery type: Sleeve Start weight at [facility]: 301.5  Weight today: 270.7 pounds   Body Composition Scale 04/05/2022 05/17/2022 08/17/2022  Current Body Weight 290.1 278.2 270.7  Total Body Fat % 46.8 45.8 45.3  Visceral Fat '17 16 15  '$ Fat-Free Mass % 53.1 54.1 54.6   Total Body Water % 41.0 41.5 41.8  Muscle-Mass lbs 34.7 34.6 34.5  BMI 45.1 43.3 42.1  Body Fat Displacement            Torso  lbs 84.2 79.2 76.1         Left Leg  lbs 16.8 15.8 15.2         Right Leg  lbs 16.8 15.8 51.2         Left Arm  lbs 8.4 7.9 7.6         Right Arm   lbs 8.4 7.9 7.6   Clinical  Medical hx: prediabetes  Medications: N/A  Labs: A1C 5.8, Vitamin D 26.6 Notable signs/symptoms: constipation when working her work week Any previous deficiencies? 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   Pt states the morning is seeing pts for 4-5 hours and then charting in the afternoon.     Estimated daily fluid intake: 60 oz Estimated daily protein intake: 60+ g Supplements: multi (does not like capsules) and calcium and fiber gummies Current average weekly physical activity:   24-Hr Dietary Recall First Meal 7:00: protein shake + iced coffee + sugar free creamer (within 1 hour) Snack: 2 string cheese  Second Meal: chicken or fish with broccoli or green beans or guac + black bean burger Snack:  powered peanut butter Third Meal: 1 egg and 2 sausage sometimes cottage cheese Snack: yasso bar Beverages: water, water + flavoring, gatorade zero  Post-Op Goals/ Signs/ Symptoms Using straws: no Drinking while eating: no Chewing/swallowing difficulties: no Changes in vision: no Changes to mood/headaches: no Hair loss/changes to skin/nails: no Difficulty focusing/concentrating:  no Sweating: no Limb weakness: no Dizziness/lightheadedness: no Palpitations: no  Carbonated/caffeinated beverages: no N/V/D/C/Gas: no Abdominal pain: no Dumping syndrome: no    NUTRITION DIAGNOSIS  Overweight/obesity ([place]-3.3) related to past poor dietary habits and physical inactivity as evidenced by completed bariatric surgery and following dietary guidelines for continued weight loss and healthy nutrition status.     NUTRITION INTERVENTION Nutrition counseling (C-1) and education (E-2) to facilitate bariatric surgery goals, including: Diet advancement to the next phase (phase 4) now including non starchy vegetables The importance of consuming adequate calories as well as certain nutrients daily due to the body's need for essential vitamins, minerals, and fats The importance of daily physical activity and to reach a goal of at least 150 minutes of moderate to vigorous physical activity weekly (or as directed by their physician) due to benefits such as increased musculature and improved lab values The importance of intuitive eating specifically learning hunger-satiety cues and understanding the importance of learning a new body: The importance of mindful eating to avoid grazing behaviors  Encouraged patient to honor their body's internal hunger and fullness cues.  Throughout the day, check in mentally and rate hunger. Stop eating when satisfied not full regardless of how much food is left on the plate.  Get more if still hungry 20-30 minutes later.  The key is to honor satisfaction so throughout the meal, rate fullness factor and stop when comfortably satisfied not physically full. The key is to honor hunger and fullness without any feelings of guilt or shame.  Pay attention to what the internal cues are, rather than any external factors. This will enhance the confidence you have in listening to your own body and following those internal cues enabling you to increase how often you eat when you  are hungry not out of appetite and stop when you are satisfied not full.  Encouraged pt to continue to eat balanced meals inclusive of non starchy vegetables 2 times a day 7 days a week Encouraged pt to choose lean protein sources: limiting beef, pork, sausage, hotdogs, and lunch meat Encourage pt to choose healthy fats such as plant based limiting animal fats Encouraged pt to continue to drink a minium 64 fluid ounces with half being plain water to satisfy proper hydration   Handouts Provided Include  Bariatric MyPlate   Learning Style & Readiness for Change Teaching method utilized: Visual & Auditory  Demonstrated degree of understanding via: Teach Back  Readiness Level: action Barriers to learning/adherence to lifestyle change: none identified   RD's Notes for Next Visit Assess adherence to pt chosen goals    MONITORING & EVALUATION Dietary intake, weekly physical activity, body weight   Next Steps Patient is to follow-up in early 3 months

## 2022-11-14 DIAGNOSIS — H52223 Regular astigmatism, bilateral: Secondary | ICD-10-CM | POA: Diagnosis not present

## 2022-11-14 DIAGNOSIS — H5213 Myopia, bilateral: Secondary | ICD-10-CM | POA: Diagnosis not present

## 2022-11-22 ENCOUNTER — Encounter: Payer: 59 | Admitting: Skilled Nursing Facility1

## 2022-11-30 ENCOUNTER — Ambulatory Visit: Payer: 59 | Admitting: Skilled Nursing Facility1

## 2023-07-12 ENCOUNTER — Other Ambulatory Visit: Payer: Self-pay | Admitting: Family Medicine

## 2023-07-12 DIAGNOSIS — N6320 Unspecified lump in the left breast, unspecified quadrant: Secondary | ICD-10-CM

## 2023-07-20 ENCOUNTER — Ambulatory Visit
Admission: RE | Admit: 2023-07-20 | Discharge: 2023-07-20 | Disposition: A | Discharge status: Home / Self Care | Payer: Commercial Managed Care - PPO | Source: Ambulatory Visit | Attending: Family Medicine | Admitting: Family Medicine

## 2023-07-20 DIAGNOSIS — N6320 Unspecified lump in the left breast, unspecified quadrant: Secondary | ICD-10-CM

## 2023-07-20 DIAGNOSIS — N6322 Unspecified lump in the left breast, upper inner quadrant: Secondary | ICD-10-CM | POA: Diagnosis not present

## 2023-10-27 ENCOUNTER — Encounter (HOSPITAL_COMMUNITY): Payer: Self-pay | Admitting: *Deleted

## 2024-08-02 DIAGNOSIS — M9902 Segmental and somatic dysfunction of thoracic region: Secondary | ICD-10-CM | POA: Diagnosis not present

## 2024-08-02 DIAGNOSIS — M9906 Segmental and somatic dysfunction of lower extremity: Secondary | ICD-10-CM | POA: Diagnosis not present

## 2024-08-02 DIAGNOSIS — M9903 Segmental and somatic dysfunction of lumbar region: Secondary | ICD-10-CM | POA: Diagnosis not present

## 2024-08-02 DIAGNOSIS — M9905 Segmental and somatic dysfunction of pelvic region: Secondary | ICD-10-CM | POA: Diagnosis not present

## 2024-08-06 DIAGNOSIS — M9906 Segmental and somatic dysfunction of lower extremity: Secondary | ICD-10-CM | POA: Diagnosis not present

## 2024-08-06 DIAGNOSIS — M9902 Segmental and somatic dysfunction of thoracic region: Secondary | ICD-10-CM | POA: Diagnosis not present

## 2024-08-06 DIAGNOSIS — M9903 Segmental and somatic dysfunction of lumbar region: Secondary | ICD-10-CM | POA: Diagnosis not present

## 2024-08-06 DIAGNOSIS — M9905 Segmental and somatic dysfunction of pelvic region: Secondary | ICD-10-CM | POA: Diagnosis not present

## 2024-08-14 DIAGNOSIS — M9902 Segmental and somatic dysfunction of thoracic region: Secondary | ICD-10-CM | POA: Diagnosis not present

## 2024-08-14 DIAGNOSIS — M9906 Segmental and somatic dysfunction of lower extremity: Secondary | ICD-10-CM | POA: Diagnosis not present

## 2024-08-14 DIAGNOSIS — M9903 Segmental and somatic dysfunction of lumbar region: Secondary | ICD-10-CM | POA: Diagnosis not present

## 2024-08-14 DIAGNOSIS — M9905 Segmental and somatic dysfunction of pelvic region: Secondary | ICD-10-CM | POA: Diagnosis not present

## 2024-08-28 DIAGNOSIS — M9902 Segmental and somatic dysfunction of thoracic region: Secondary | ICD-10-CM | POA: Diagnosis not present

## 2024-08-28 DIAGNOSIS — M9903 Segmental and somatic dysfunction of lumbar region: Secondary | ICD-10-CM | POA: Diagnosis not present

## 2024-08-28 DIAGNOSIS — M9905 Segmental and somatic dysfunction of pelvic region: Secondary | ICD-10-CM | POA: Diagnosis not present

## 2024-08-28 DIAGNOSIS — M9906 Segmental and somatic dysfunction of lower extremity: Secondary | ICD-10-CM | POA: Diagnosis not present

## 2024-09-03 DIAGNOSIS — M9905 Segmental and somatic dysfunction of pelvic region: Secondary | ICD-10-CM | POA: Diagnosis not present

## 2024-09-03 DIAGNOSIS — M9906 Segmental and somatic dysfunction of lower extremity: Secondary | ICD-10-CM | POA: Diagnosis not present

## 2024-09-03 DIAGNOSIS — M9903 Segmental and somatic dysfunction of lumbar region: Secondary | ICD-10-CM | POA: Diagnosis not present

## 2024-09-03 DIAGNOSIS — M9902 Segmental and somatic dysfunction of thoracic region: Secondary | ICD-10-CM | POA: Diagnosis not present

## 2024-09-11 DIAGNOSIS — M9903 Segmental and somatic dysfunction of lumbar region: Secondary | ICD-10-CM | POA: Diagnosis not present

## 2024-09-11 DIAGNOSIS — M9905 Segmental and somatic dysfunction of pelvic region: Secondary | ICD-10-CM | POA: Diagnosis not present

## 2024-09-11 DIAGNOSIS — M9906 Segmental and somatic dysfunction of lower extremity: Secondary | ICD-10-CM | POA: Diagnosis not present

## 2024-09-11 DIAGNOSIS — M9902 Segmental and somatic dysfunction of thoracic region: Secondary | ICD-10-CM | POA: Diagnosis not present

## 2024-09-17 DIAGNOSIS — M9905 Segmental and somatic dysfunction of pelvic region: Secondary | ICD-10-CM | POA: Diagnosis not present

## 2024-09-17 DIAGNOSIS — M9903 Segmental and somatic dysfunction of lumbar region: Secondary | ICD-10-CM | POA: Diagnosis not present

## 2024-09-17 DIAGNOSIS — M9902 Segmental and somatic dysfunction of thoracic region: Secondary | ICD-10-CM | POA: Diagnosis not present

## 2024-09-17 DIAGNOSIS — M9906 Segmental and somatic dysfunction of lower extremity: Secondary | ICD-10-CM | POA: Diagnosis not present

## 2024-10-01 DIAGNOSIS — M9903 Segmental and somatic dysfunction of lumbar region: Secondary | ICD-10-CM | POA: Diagnosis not present

## 2024-10-01 DIAGNOSIS — M9902 Segmental and somatic dysfunction of thoracic region: Secondary | ICD-10-CM | POA: Diagnosis not present

## 2024-10-01 DIAGNOSIS — M9906 Segmental and somatic dysfunction of lower extremity: Secondary | ICD-10-CM | POA: Diagnosis not present

## 2024-10-01 DIAGNOSIS — M9905 Segmental and somatic dysfunction of pelvic region: Secondary | ICD-10-CM | POA: Diagnosis not present

## 2024-10-09 DIAGNOSIS — M9905 Segmental and somatic dysfunction of pelvic region: Secondary | ICD-10-CM | POA: Diagnosis not present

## 2024-10-09 DIAGNOSIS — M9903 Segmental and somatic dysfunction of lumbar region: Secondary | ICD-10-CM | POA: Diagnosis not present

## 2024-10-09 DIAGNOSIS — M9902 Segmental and somatic dysfunction of thoracic region: Secondary | ICD-10-CM | POA: Diagnosis not present

## 2024-10-09 DIAGNOSIS — M9906 Segmental and somatic dysfunction of lower extremity: Secondary | ICD-10-CM | POA: Diagnosis not present

## 2024-10-14 DIAGNOSIS — M9902 Segmental and somatic dysfunction of thoracic region: Secondary | ICD-10-CM | POA: Diagnosis not present

## 2024-10-14 DIAGNOSIS — M9903 Segmental and somatic dysfunction of lumbar region: Secondary | ICD-10-CM | POA: Diagnosis not present

## 2024-10-14 DIAGNOSIS — M9905 Segmental and somatic dysfunction of pelvic region: Secondary | ICD-10-CM | POA: Diagnosis not present

## 2024-10-14 DIAGNOSIS — M9906 Segmental and somatic dysfunction of lower extremity: Secondary | ICD-10-CM | POA: Diagnosis not present

## 2024-10-22 ENCOUNTER — Encounter (HOSPITAL_COMMUNITY): Payer: Self-pay | Admitting: *Deleted

## 2024-10-29 DIAGNOSIS — M9903 Segmental and somatic dysfunction of lumbar region: Secondary | ICD-10-CM | POA: Diagnosis not present

## 2024-10-29 DIAGNOSIS — M9906 Segmental and somatic dysfunction of lower extremity: Secondary | ICD-10-CM | POA: Diagnosis not present

## 2024-10-29 DIAGNOSIS — M9902 Segmental and somatic dysfunction of thoracic region: Secondary | ICD-10-CM | POA: Diagnosis not present

## 2024-10-29 DIAGNOSIS — M9905 Segmental and somatic dysfunction of pelvic region: Secondary | ICD-10-CM | POA: Diagnosis not present

## 2024-11-06 DIAGNOSIS — M9902 Segmental and somatic dysfunction of thoracic region: Secondary | ICD-10-CM | POA: Diagnosis not present

## 2024-11-06 DIAGNOSIS — M9903 Segmental and somatic dysfunction of lumbar region: Secondary | ICD-10-CM | POA: Diagnosis not present

## 2024-11-06 DIAGNOSIS — M9905 Segmental and somatic dysfunction of pelvic region: Secondary | ICD-10-CM | POA: Diagnosis not present

## 2024-11-06 DIAGNOSIS — M9906 Segmental and somatic dysfunction of lower extremity: Secondary | ICD-10-CM | POA: Diagnosis not present
